# Patient Record
Sex: Female | Born: 1965 | Race: White | Hispanic: No | Marital: Married | Smoking: Never smoker
Health system: Southern US, Community
[De-identification: ages and names within clinical notes are randomized; demographics above are authoritative.]

## PROBLEM LIST (undated history)

## (undated) DIAGNOSIS — Z86010 Personal history of colonic polyps: Secondary | ICD-10-CM

## (undated) DIAGNOSIS — Z8632 Personal history of gestational diabetes: Secondary | ICD-10-CM

## (undated) DIAGNOSIS — G43009 Migraine without aura, not intractable, without status migrainosus: Secondary | ICD-10-CM

## (undated) HISTORY — PX: GYNECOLOGIC CRYOSURGERY: SHX857

## (undated) HISTORY — DX: Personal history of gestational diabetes: Z86.32

## (undated) HISTORY — DX: Personal history of colonic polyps: Z86.010

## (undated) HISTORY — PX: DILATION AND CURETTAGE OF UTERUS: SHX78

## (undated) HISTORY — DX: Migraine without aura, not intractable, without status migrainosus: G43.009

---

## 2002-10-26 ENCOUNTER — Other Ambulatory Visit: Admission: RE | Admit: 2002-10-26 | Discharge: 2002-10-26 | Payer: Self-pay | Admitting: Obstetrics and Gynecology

## 2006-09-10 ENCOUNTER — Other Ambulatory Visit: Admission: RE | Admit: 2006-09-10 | Discharge: 2006-09-10 | Payer: Self-pay | Admitting: Obstetrics and Gynecology

## 2008-10-05 ENCOUNTER — Other Ambulatory Visit: Admission: RE | Admit: 2008-10-05 | Discharge: 2008-10-05 | Payer: Self-pay | Admitting: Obstetrics and Gynecology

## 2008-10-18 ENCOUNTER — Ambulatory Visit (HOSPITAL_COMMUNITY): Admission: RE | Admit: 2008-10-18 | Discharge: 2008-10-18 | Payer: Self-pay | Admitting: Obstetrics and Gynecology

## 2008-10-25 ENCOUNTER — Encounter: Admission: RE | Admit: 2008-10-25 | Discharge: 2008-10-25 | Payer: Self-pay | Admitting: Obstetrics and Gynecology

## 2009-10-31 ENCOUNTER — Other Ambulatory Visit: Admission: RE | Admit: 2009-10-31 | Discharge: 2009-10-31 | Payer: Self-pay | Admitting: Obstetrics and Gynecology

## 2009-12-04 ENCOUNTER — Ambulatory Visit (HOSPITAL_COMMUNITY): Admission: RE | Admit: 2009-12-04 | Discharge: 2009-12-04 | Payer: Self-pay | Admitting: Obstetrics and Gynecology

## 2010-11-06 ENCOUNTER — Other Ambulatory Visit: Payer: Self-pay | Admitting: Obstetrics and Gynecology

## 2010-11-06 ENCOUNTER — Other Ambulatory Visit (HOSPITAL_COMMUNITY)
Admission: RE | Admit: 2010-11-06 | Discharge: 2010-11-06 | Disposition: A | Discharge status: Home / Self Care | Payer: BC Managed Care – PPO | Source: Ambulatory Visit | Attending: Obstetrics and Gynecology | Admitting: Obstetrics and Gynecology

## 2010-11-06 DIAGNOSIS — Z01419 Encounter for gynecological examination (general) (routine) without abnormal findings: Secondary | ICD-10-CM | POA: Insufficient documentation

## 2010-11-20 ENCOUNTER — Other Ambulatory Visit (HOSPITAL_COMMUNITY): Payer: Self-pay | Admitting: Obstetrics and Gynecology

## 2010-11-20 DIAGNOSIS — Z1231 Encounter for screening mammogram for malignant neoplasm of breast: Secondary | ICD-10-CM

## 2010-12-06 ENCOUNTER — Ambulatory Visit (HOSPITAL_COMMUNITY): Payer: BC Managed Care – PPO | Attending: Obstetrics and Gynecology

## 2011-02-12 ENCOUNTER — Ambulatory Visit (HOSPITAL_COMMUNITY): Payer: BC Managed Care – PPO

## 2011-03-06 ENCOUNTER — Ambulatory Visit (HOSPITAL_COMMUNITY)
Admission: RE | Admit: 2011-03-06 | Discharge: 2011-03-06 | Disposition: A | Discharge status: Home / Self Care | Payer: BC Managed Care – PPO | Source: Ambulatory Visit | Attending: Obstetrics and Gynecology | Admitting: Obstetrics and Gynecology

## 2011-03-06 DIAGNOSIS — Z1231 Encounter for screening mammogram for malignant neoplasm of breast: Secondary | ICD-10-CM | POA: Insufficient documentation

## 2012-08-12 ENCOUNTER — Other Ambulatory Visit (HOSPITAL_COMMUNITY): Payer: Self-pay | Admitting: Obstetrics and Gynecology

## 2012-08-12 DIAGNOSIS — Z1231 Encounter for screening mammogram for malignant neoplasm of breast: Secondary | ICD-10-CM

## 2012-08-14 ENCOUNTER — Ambulatory Visit (HOSPITAL_COMMUNITY)
Admission: RE | Admit: 2012-08-14 | Discharge: 2012-08-14 | Disposition: A | Discharge status: Home / Self Care | Payer: BC Managed Care – PPO | Source: Ambulatory Visit | Attending: Obstetrics and Gynecology | Admitting: Obstetrics and Gynecology

## 2012-08-14 DIAGNOSIS — Z1231 Encounter for screening mammogram for malignant neoplasm of breast: Secondary | ICD-10-CM

## 2013-06-22 ENCOUNTER — Other Ambulatory Visit (HOSPITAL_COMMUNITY): Payer: Self-pay | Admitting: Obstetrics and Gynecology

## 2013-06-22 DIAGNOSIS — Z1231 Encounter for screening mammogram for malignant neoplasm of breast: Secondary | ICD-10-CM

## 2013-08-16 ENCOUNTER — Ambulatory Visit (HOSPITAL_COMMUNITY)
Admission: RE | Admit: 2013-08-16 | Discharge: 2013-08-16 | Disposition: A | Discharge status: Home / Self Care | Payer: BC Managed Care – PPO | Source: Ambulatory Visit | Attending: Obstetrics and Gynecology | Admitting: Obstetrics and Gynecology

## 2013-08-16 DIAGNOSIS — Z1231 Encounter for screening mammogram for malignant neoplasm of breast: Secondary | ICD-10-CM | POA: Insufficient documentation

## 2014-08-23 ENCOUNTER — Ambulatory Visit (INDEPENDENT_AMBULATORY_CARE_PROVIDER_SITE_OTHER): Payer: BC Managed Care – PPO | Admitting: Internal Medicine

## 2014-08-23 ENCOUNTER — Ambulatory Visit (HOSPITAL_COMMUNITY)
Admission: RE | Admit: 2014-08-23 | Discharge: 2014-08-23 | Disposition: A | Discharge status: Home / Self Care | Payer: BLUE CROSS/BLUE SHIELD | Source: Ambulatory Visit | Attending: Internal Medicine | Admitting: Internal Medicine

## 2014-08-23 ENCOUNTER — Encounter: Payer: Self-pay | Admitting: Internal Medicine

## 2014-08-23 ENCOUNTER — Encounter: Payer: Self-pay | Admitting: *Deleted

## 2014-08-23 ENCOUNTER — Other Ambulatory Visit: Payer: Self-pay | Admitting: Internal Medicine

## 2014-08-23 VITALS — BP 119/65 | HR 78 | Resp 16 | Ht 63.75 in | Wt 161.0 lb

## 2014-08-23 DIAGNOSIS — R635 Abnormal weight gain: Secondary | ICD-10-CM | POA: Diagnosis not present

## 2014-08-23 DIAGNOSIS — Z1231 Encounter for screening mammogram for malignant neoplasm of breast: Secondary | ICD-10-CM | POA: Diagnosis not present

## 2014-08-23 DIAGNOSIS — G43809 Other migraine, not intractable, without status migrainosus: Secondary | ICD-10-CM

## 2014-08-23 DIAGNOSIS — N951 Menopausal and female climacteric states: Secondary | ICD-10-CM | POA: Diagnosis not present

## 2014-08-23 DIAGNOSIS — E559 Vitamin D deficiency, unspecified: Secondary | ICD-10-CM

## 2014-08-23 DIAGNOSIS — G43909 Migraine, unspecified, not intractable, without status migrainosus: Secondary | ICD-10-CM | POA: Insufficient documentation

## 2014-08-23 LAB — CBC WITH DIFFERENTIAL/PLATELET
Basophils Absolute: 0 10*3/uL (ref 0.0–0.1)
Basophils Relative: 0 % (ref 0–1)
Eosinophils Absolute: 0 10*3/uL (ref 0.0–0.7)
Eosinophils Relative: 1 % (ref 0–5)
HCT: 39.3 % (ref 36.0–46.0)
Hemoglobin: 13.6 g/dL (ref 12.0–15.0)
Lymphocytes Relative: 26 % (ref 12–46)
Lymphs Abs: 1.2 10*3/uL (ref 0.7–4.0)
MCH: 31.7 pg (ref 26.0–34.0)
MCHC: 34.6 g/dL (ref 30.0–36.0)
MCV: 91.6 fL (ref 78.0–100.0)
MPV: 11.5 fL (ref 8.6–12.4)
Monocytes Absolute: 0.4 10*3/uL (ref 0.1–1.0)
Monocytes Relative: 8 % (ref 3–12)
Neutro Abs: 3.1 10*3/uL (ref 1.7–7.7)
Neutrophils Relative %: 65 % (ref 43–77)
Platelets: 177 10*3/uL (ref 150–400)
RBC: 4.29 MIL/uL (ref 3.87–5.11)
RDW: 12.5 % (ref 11.5–15.5)
WBC: 4.8 10*3/uL (ref 4.0–10.5)

## 2014-08-23 LAB — LIPID PANEL
Cholesterol: 185 mg/dL (ref 0–200)
HDL: 64 mg/dL (ref >39)
LDL Cholesterol: 95 mg/dL (ref 0–99)
Total CHOL/HDL Ratio: 2.9 Ratio
Triglycerides: 128 mg/dL (ref <150)
VLDL: 26 mg/dL (ref 0–40)

## 2014-08-23 LAB — COMPREHENSIVE METABOLIC PANEL
ALT: 12 U/L (ref 0–35)
AST: 19 U/L (ref 0–37)
Albumin: 4.3 g/dL (ref 3.5–5.2)
Alkaline Phosphatase: 56 U/L (ref 39–117)
BUN: 11 mg/dL (ref 6–23)
CO2: 25 mEq/L (ref 19–32)
Calcium: 9 mg/dL (ref 8.4–10.5)
Chloride: 102 mEq/L (ref 96–112)
Creat: 0.57 mg/dL (ref 0.50–1.10)
Glucose, Bld: 80 mg/dL (ref 70–99)
Potassium: 3.9 mEq/L (ref 3.5–5.3)
Sodium: 137 mEq/L (ref 135–145)
Total Bilirubin: 0.5 mg/dL (ref 0.2–1.2)
Total Protein: 7.3 g/dL (ref 6.0–8.3)

## 2014-08-23 LAB — T3, FREE: T3, Free: 3 pg/mL (ref 2.3–4.2)

## 2014-08-23 LAB — T4, FREE: Free T4: 1 ng/dL (ref 0.80–1.80)

## 2014-08-23 LAB — TSH: TSH: 1.235 u[IU]/mL (ref 0.350–4.500)

## 2014-08-23 NOTE — Patient Instructions (Signed)
See me as needed 

## 2014-08-23 NOTE — Progress Notes (Signed)
   Subjective:    Patient ID: Tammy Terrell, female    DOB: 1966/08/01, 49 y.o.   MRN: [id number]  HPI New pt here for first visit   PMH migraine headache  ( diagnosed in childhood),  Vitamin D deficiency  She is concerned about weight gain.  Has tried [employer] in past and was on an herbal/supplement diet that made her nauseated   LMP December,  Has started to skip menses.  Only one hot flush a day.  No night time flushing   No Known Allergies History reviewed. No pertinent past medical history. Past Surgical History  Procedure Laterality Date  . Dilation and curettage of uterus  1997/1998   History   Social History  . Marital Status: Married    Spouse Name: N/A    Number of Children: N/A  . Years of Education: N/A   Occupational History  . Not on file.   Social History Main Topics  . Smoking status: Never Smoker   . Smokeless tobacco: Never Used  . Alcohol Use: 1.2 oz/week    2 Not specified per week  . Drug Use: No  . Sexual Activity:    Partners: Male   Other Topics Concern  . Not on file   Social History Narrative  . No narrative on file   Family History  Problem Relation Age of Onset  . Diabetes Mother   . Melanoma Mother   . Heart disease Father   . Colon cancer Paternal Aunt    There are no active problems to display for this patient.  No current outpatient prescriptions on file prior to visit.   No current facility-administered medications on file prior to visit.       Review of Systems See HPI    Objective:   Physical Exam Physical Exam  Nursing note and vitals reviewed.  Constitutional: She is oriented to person, place, and time. She appears well-developed and well-nourished.  HENT:  Head: Normocephalic and atraumatic.  Cardiovascular: Normal rate and regular rhythm. Exam reveals no gallop and no friction rub.  No murmur heard.  Pulmonary/Chest: Breath sounds normal. She has no wheezes. She has no rales.  Neurological: She is alert and  oriented to person, place, and time.  Skin: Skin is warm and dry.  Psychiatric: She has a normal mood and affect. Her behavior is normal.              Assessment & Plan:  Weight gain  Will get TSH and all labs  Irregular menses  No heavy bleeding  Will get FSH,  Thyroid   Migraine  Continue OTC of choice  Vitamin D deficiency will check today  Schedule CPE

## 2014-08-24 LAB — VITAMIN D 25 HYDROXY (VIT D DEFICIENCY, FRACTURES): Vit D, 25-Hydroxy: 35 ng/mL (ref 30–100)

## 2014-08-28 ENCOUNTER — Encounter: Payer: Self-pay | Admitting: Internal Medicine

## 2015-05-31 ENCOUNTER — Encounter: Payer: BC Managed Care – PPO | Admitting: Internal Medicine

## 2015-08-18 ENCOUNTER — Other Ambulatory Visit: Payer: Self-pay

## 2015-08-18 DIAGNOSIS — Z1231 Encounter for screening mammogram for malignant neoplasm of breast: Secondary | ICD-10-CM

## 2015-09-06 ENCOUNTER — Ambulatory Visit: Payer: BC Managed Care – PPO

## 2015-12-29 ENCOUNTER — Ambulatory Visit
Admission: RE | Admit: 2015-12-29 | Discharge: 2015-12-29 | Disposition: A | Discharge status: Home / Self Care | Payer: Self-pay | Source: Ambulatory Visit

## 2015-12-29 DIAGNOSIS — Z1231 Encounter for screening mammogram for malignant neoplasm of breast: Secondary | ICD-10-CM

## 2017-03-26 ENCOUNTER — Other Ambulatory Visit: Payer: Self-pay | Admitting: Obstetrics and Gynecology

## 2017-03-26 DIAGNOSIS — Z1231 Encounter for screening mammogram for malignant neoplasm of breast: Secondary | ICD-10-CM

## 2017-03-27 ENCOUNTER — Ambulatory Visit
Admission: RE | Admit: 2017-03-27 | Discharge: 2017-03-27 | Disposition: A | Discharge status: Home / Self Care | Payer: Managed Care, Other (non HMO) | Source: Ambulatory Visit | Attending: Obstetrics and Gynecology | Admitting: Obstetrics and Gynecology

## 2017-03-27 DIAGNOSIS — Z1231 Encounter for screening mammogram for malignant neoplasm of breast: Secondary | ICD-10-CM

## 2017-05-01 ENCOUNTER — Encounter: Payer: Self-pay | Admitting: Obstetrics and Gynecology

## 2017-05-01 ENCOUNTER — Ambulatory Visit (INDEPENDENT_AMBULATORY_CARE_PROVIDER_SITE_OTHER): Payer: Managed Care, Other (non HMO) | Admitting: Obstetrics and Gynecology

## 2017-05-01 ENCOUNTER — Other Ambulatory Visit (HOSPITAL_COMMUNITY)
Admission: RE | Admit: 2017-05-01 | Discharge: 2017-05-01 | Disposition: A | Discharge status: Home / Self Care | Payer: Managed Care, Other (non HMO) | Source: Ambulatory Visit | Attending: Obstetrics and Gynecology | Admitting: Obstetrics and Gynecology

## 2017-05-01 VITALS — BP 112/62 | HR 76 | Resp 16 | Ht 64.77 in | Wt 165.8 lb

## 2017-05-01 DIAGNOSIS — Z01419 Encounter for gynecological examination (general) (routine) without abnormal findings: Secondary | ICD-10-CM | POA: Diagnosis not present

## 2017-05-01 DIAGNOSIS — Z124 Encounter for screening for malignant neoplasm of cervix: Secondary | ICD-10-CM | POA: Diagnosis present

## 2017-05-01 DIAGNOSIS — Z8632 Personal history of gestational diabetes: Secondary | ICD-10-CM | POA: Diagnosis not present

## 2017-05-01 DIAGNOSIS — R635 Abnormal weight gain: Secondary | ICD-10-CM

## 2017-05-01 DIAGNOSIS — N951 Menopausal and female climacteric states: Secondary | ICD-10-CM | POA: Diagnosis not present

## 2017-05-01 DIAGNOSIS — Z0000 Encounter for general adult medical examination without abnormal findings: Secondary | ICD-10-CM

## 2017-05-01 NOTE — Progress Notes (Signed)
51 y.o. [id number] MarriedCaucasianF here as a new patient for annual exam.  Patient states that her last period was in November 2017 and before that around March 2017. No spotting since 11/17.  She is having hot flashes, night sweats in the last several months (have come and gone over the last few years). Not currently tolerable. She has put on weight, hoping it will improve if she looses some weight.  She is up 2-3 x a night. Not sweating as badly as she did in the past.  Sexually active, no pain    No LMP recorded. Patient is not currently having periods (Reason: Irregular Periods).          Sexually active: Yes.    The current method of family planning is vasectomy.    Exercising: Yes.    Elliptical and bike Smoker:  no  Health Maintenance: Pap:  2014 with Dr. Teryl Lucy - normal per patient History of abnormal Pap:  Yes, years ago -- patient had Cryosurgery MMG:  03/27/17 BIRADS 1 negative/density b Colonoscopy:  n/a BMD:   Done with Dr. Clayborne Dana at [facility] -- normal per patient TDaP:  Unsure if UTD Gardasil: n/a   reports that she has never smoked. She has never used smokeless tobacco. She reports that she drinks about 1.2 oz of alcohol per week . She reports that she does not use drugs. Homemaker. Kids are 23, 55, 48. 16 year old daughter is a Equities trader in [employer]. 49 year old in school. Oldest son is applying to med school.   Past Medical History:  Diagnosis Date  . Migraine without aura     Past Surgical History:  Procedure Laterality Date  . [place] OF UTERUS  1997/1998  . GYNECOLOGIC CRYOSURGERY    One D&C was for retained placenta.  2 D&C's for SAB  Current Outpatient Prescriptions  Medication Sig Dispense Refill  . Multiple Vitamin (MULTIVITAMIN) tablet Take 1 tablet by mouth daily.     No current facility-administered medications for this visit.     Family History  Problem Relation Age of Onset  . Diabetes Mother   . Melanoma Mother    . Heart disease Father   . Seizures Father   . Colon cancer Paternal Aunt   . Heart disease Maternal Grandmother   . Diabetes Maternal Grandfather   . Cancer Paternal Grandfather   PGF not sure what type of cancer PAunt was close to 12 with her colon cancer.   Review of Systems  Constitutional: Negative.   HENT: Negative.   Eyes: Negative.   Respiratory: Negative.   Cardiovascular: Negative.   Gastrointestinal: Negative.   Endocrine: Negative.   Genitourinary: Negative.   Musculoskeletal: Negative.   Skin: Negative.   Allergic/Immunologic: Negative.   Neurological: Negative.   Hematological: Negative.   Psychiatric/Behavioral: Negative.     Exam:   BP 112/62 (BP Location: Right Arm, Patient Position: Sitting, Cuff Size: Normal)   Pulse 76   Resp 16   Ht 5' 4.77" (1.645 m)   Wt 165 lb 12.8 oz (75.2 kg)   BMI 27.79 kg/m   Weight change: @WEIGHTCHANGE @ Height:   Height: 5' 4.77" (164.5 cm)  Ht Readings from Last 3 Encounters:  05/01/17 5' 4.77" (1.645 m)  08/23/14 5' 3.75" (1.619 m)    General appearance: alert, cooperative and appears stated age Head: Normocephalic, without obvious abnormality, atraumatic Neck: no adenopathy, supple, symmetrical, trachea midline and thyroid normal to inspection and palpation Lungs:  clear to auscultation bilaterally Cardiovascular: regular rate and rhythm Breasts: normal appearance, no masses or tenderness Abdomen: soft, non-tender; non distended,  no masses,  no organomegaly Extremities: extremities normal, atraumatic, no cyanosis or edema Skin: Skin color, texture, turgor normal. No rashes or lesions Lymph nodes: Cervical, supraclavicular, and axillary nodes normal. No abnormal inguinal nodes palpated Neurologic: Grossly normal   Pelvic: External genitalia:  no lesions              Urethra:  normal appearing urethra with no masses, tenderness or lesions              Bartholins and Skenes: normal                 Vagina: normal  appearing vagina with normal color and discharge, no lesions              Cervix: no lesions               Bimanual Exam:  Uterus:  normal size, contour, position, consistency, mobility, non-tender              Adnexa: no mass, fullness, tenderness               Rectovaginal: Confirms               Anus:  normal sphincter tone, no lesions  Chaperone was present for exam.  A:  Well Woman with normal exam  Vasomotor symptoms  Weight gain  Gestational diabetes  P:   She will try OTC agents for menopausal symptoms, given information on HRT  Call with any bleeding  Pap with hpv  Discussed breast self exam  Discussed calcium and vit D intake  Mammogram UTD  Discussed colonoscopy now or waiting until 52, she wants to wait  Screening labs with tsh   HgbA1C

## 2017-05-01 NOTE — Patient Instructions (Addendum)
Try over the counter estroven and estroven pm  EXERCISE AND DIET:  We recommended that you start or continue a regular exercise program for good health. Regular exercise means any activity that makes your heart beat faster and makes you sweat.  We recommend exercising at least 30 minutes per day at least 3 days a week, preferably 4 or 5.  We also recommend a diet low in fat and sugar.  Inactivity, poor dietary choices and obesity can cause diabetes, heart attack, stroke, and kidney damage, among others.    ALCOHOL AND SMOKING:  Women should limit their alcohol intake to no more than 7 drinks/beers/glasses of wine (combined, not each!) per week. Moderation of alcohol intake to this level decreases your risk of breast cancer and liver damage. And of course, no recreational drugs are part of a healthy lifestyle.  And absolutely no smoking or even second hand smoke. Most people know smoking can cause heart and lung diseases, but did you know it also contributes to weakening of your bones? Aging of your skin?  Yellowing of your teeth and nails?  CALCIUM AND VITAMIN D:  Adequate intake of calcium and Vitamin D are recommended.  The recommendations for exact amounts of these supplements seem to change often, but generally speaking 600 mg of calcium (either carbonate or citrate) and 800 units of Vitamin D per day seems prudent. Certain women may benefit from higher intake of Vitamin D.  If you are among these women, your doctor will have told you during your visit.    PAP SMEARS:  Pap smears, to check for cervical cancer or precancers,  have traditionally been done yearly, although recent scientific advances have shown that most women can have pap smears less often.  However, every woman still should have a physical exam from her gynecologist every year. It will include a breast check, inspection of the vulva and vagina to check for abnormal growths or skin changes, a visual exam of the cervix, and then an exam to  evaluate the size and shape of the uterus and ovaries.  And after 49 years of age, a rectal exam is indicated to check for rectal cancers. We will also provide age appropriate advice regarding health maintenance, like when you should have certain vaccines, screening for sexually transmitted diseases, bone density testing, colonoscopy, mammograms, etc.   MAMMOGRAMS:  All women over 43 years old should have a yearly mammogram. Many facilities now offer a "3D" mammogram, which may cost around $50 extra out of pocket. If possible,  we recommend you accept the option to have the 3D mammogram performed.  It both reduces the number of women who will be called back for extra views which then turn out to be normal, and it is better than the routine mammogram at detecting truly abnormal areas.    COLONOSCOPY:  Colonoscopy to screen for colon cancer is recommended for all women at age 87.  We know, you hate the idea of the prep.  We agree, BUT, having colon cancer and not knowing it is worse!!  Colon cancer so often starts as a polyp that can be seen and removed at colonscopy, which can quite literally save your life!  And if your first colonoscopy is normal and you have no family history of colon cancer, most women don't have to have it again for 10 years.  Once every ten years, you can do something that may end up saving your life, right?  We will be happy to  help you get it scheduled when you are ready.  Be sure to check your insurance coverage so you understand how much it will cost.  It may be covered as a preventative service at no cost, but you should check your particular policy.      Menopause and Hormone Replacement Therapy What is hormone replacement therapy? Hormone replacement therapy (HRT) is the use of artificial (synthetic) hormones to replace hormones that your body stops producing during menopause. Menopause is the normal time of life when menstrual periods stop completely and the ovaries stop  producing the female hormones estrogen and progesterone. This lack of hormones can affect your health and cause undesirable symptoms. HRT can relieve some of those symptoms. What are my options for HRT? HRT may consist of the synthetic hormones estrogen and progestin, or it may consist of only estrogen (estrogen-only therapy). You and your health care provider will decide which form of HRT is best for you. If you choose to be on HRT and you have a uterus, estrogen and progestin are usually prescribed. Estrogen-only therapy is used for women who do not have a uterus. Possible options for taking HRT include:  Pills.  Patches.  Gels.  Sprays.  Vaginal cream.  Vaginal rings.  Vaginal inserts.  The amount of hormone(s) that you take and how long you take the hormone(s) varies depending on your individual health. It is important to:  Begin HRT with the lowest possible dosage.  Stop HRT as soon as your health care provider tells you to stop.  Work with your health care provider so that you feel informed and comfortable with your decisions.  What are the benefits of HRT? HRT can reduce the frequency and severity of menopausal symptoms. Benefits of HRT vary depending on the menopausal symptoms that you have, the severity of your symptoms, and your overall health. HRT may help to improve the following menopausal symptoms:  Hot flashes and night sweats. These are sudden feelings of heat that spread over the face and body. The skin may turn red, like a blush. Night sweats are hot flashes that happen while you are sleeping or trying to sleep.  Bone loss (osteoporosis). The body loses calcium more quickly after menopause, causing the bones to become weaker. This can increase the risk for bone breaks (fractures).  Vaginal dryness. The lining of the vagina can become thin and dry, which can cause pain during sexual intercourse or cause infection, burning, or itching.  Urinary tract  infections.  Urinary incontinence. This is a decreased ability to control when you urinate.  Irritability.  Short-term memory problems.  What are the risks of HRT? Risks of HRT vary depending on your individual health and medical history. Risks of HRT also depend on whether you receive both estrogen and progestin or you receive estrogen only.HRT may increase the risk of:  Spotting. This is when a small amount of bloodleaks from the vagina unexpectedly.  Endometrial cancer. This cancer is in the lining of the uterus (endometrium).  Breast cancer.  Increased density of breast tissue. This can make it harder to find breast cancer on a breast X-ray (mammogram).  Stroke.  Heart attack.  Blood clots.  Gallbladder disease.  Risks of HRT can increase if you have any of the following conditions:  Endometrial cancer.  Liver disease.  Heart disease.  Breast cancer.  History of blood clots.  History of stroke.  How should I care for myself while I am on HRT?  Take over-the-counter  and prescription medicines only as told by your health care provider.  Get mammograms, pelvic exams, and medical checkups as often as told by your health care provider.  Have Pap tests done as often as told by your health care provider. A Pap test is sometimes called a Pap smear. It is a screening test that is used to check for signs of cancer of the cervix and vagina. A Pap test can also identify the presence of infection or precancerous changes. Pap tests may be done: ? Every 3 years, starting at age 49. ? Every 5 years, starting after age 63, in combination with testing for human papillomavirus (HPV). ? More often or less often depending on other medical conditions you have, your age, and other risk factors.  It is your responsibility to get your Pap test results. Ask your health care provider or the department performing the test when your results will be ready.  Keep all follow-up visits as  told by your health care provider. This is important. When should I seek medical care? Talk with your health care provider if:  You have any of these: ? Pain or swelling in your legs. ? Shortness of breath. ? Chest pain. ? Lumps or changes in your breasts or armpits. ? Slurred speech. ? Pain, burning, or bleeding when you urine.  You develop any of these: ? Unusual vaginal bleeding. ? Dizziness or headaches. ? Weakness or numbness in any part of your arms or legs. ? Pain in your abdomen.  This information is not intended to replace advice given to you by your health care provider. Make sure you discuss any questions you have with your health care provider. Document Released: 04/20/2003 Document Revised: 06/18/2016 Document Reviewed: 01/23/2015 Elsevier Interactive Patient Education  2017 [employer].

## 2017-05-02 LAB — COMPREHENSIVE METABOLIC PANEL
ALT: 7 IU/L (ref 0–32)
AST: 15 IU/L (ref 0–40)
Albumin/Globulin Ratio: 1.9 (ref 1.2–2.2)
Albumin: 4.9 g/dL (ref 3.5–5.5)
Alkaline Phosphatase: 72 IU/L (ref 39–117)
BUN/Creatinine Ratio: 15 (ref 9–23)
BUN: 10 mg/dL (ref 6–24)
Bilirubin Total: 0.6 mg/dL (ref 0.0–1.2)
CO2: 24 mmol/L (ref 20–29)
Calcium: 9.6 mg/dL (ref 8.7–10.2)
Chloride: 101 mmol/L (ref 96–106)
Creatinine, Ser: 0.67 mg/dL (ref 0.57–1.00)
GFR calc Af Amer: 120 mL/min/{1.73_m2} (ref >59)
GFR calc non Af Amer: 104 mL/min/{1.73_m2} (ref >59)
Globulin, Total: 2.6 g/dL (ref 1.5–4.5)
Glucose: 92 mg/dL (ref 65–99)
Potassium: 4.3 mmol/L (ref 3.5–5.2)
Sodium: 140 mmol/L (ref 134–144)
Total Protein: 7.5 g/dL (ref 6.0–8.5)

## 2017-05-02 LAB — CBC
Hematocrit: 40.8 % (ref 34.0–46.6)
Hemoglobin: 14.3 g/dL (ref 11.1–15.9)
MCH: 31.6 pg (ref 26.6–33.0)
MCHC: 35 g/dL (ref 31.5–35.7)
MCV: 90 fL (ref 79–97)
Platelets: 205 10*3/uL (ref 150–379)
RBC: 4.52 x10E6/uL (ref 3.77–5.28)
RDW: 12.6 % (ref 12.3–15.4)
WBC: 4.5 10*3/uL (ref 3.4–10.8)

## 2017-05-02 LAB — CYTOLOGY - PAP
Adequacy: ABSENT
Diagnosis: NEGATIVE
HPV: NOT DETECTED

## 2017-05-02 LAB — TSH: TSH: 1.25 u[IU]/mL (ref 0.450–4.500)

## 2017-05-02 LAB — LIPID PANEL
Chol/HDL Ratio: 3.1 ratio (ref 0.0–4.4)
Cholesterol, Total: 187 mg/dL (ref 100–199)
HDL: 60 mg/dL (ref >39)
LDL Calculated: 98 mg/dL (ref 0–99)
Triglycerides: 143 mg/dL (ref 0–149)
VLDL Cholesterol Cal: 29 mg/dL (ref 5–40)

## 2017-05-02 LAB — HEMOGLOBIN A1C
Est. average glucose Bld gHb Est-mCnc: 100 mg/dL
Hgb A1c MFr Bld: 5.1 % (ref 4.8–5.6)

## 2018-05-04 NOTE — Progress Notes (Signed)
52 y.o. [id number] Married White or Caucasian Not Hispanic or Latino female here for annual exam.  She had some brown vaginal discharge begining of September. Prior to that she had spotting ~2/19, prior to that over a year without bleeding. Bad vasomotor symptoms. Not sleeping well. She is up 3-4 x a night either hot or sweating. She is having hot flashes 5-6 x a day. She hasn't tried the over the counter products. She c/o hair loss.  Husband had a CABG in 4/19, doing well. Kids are in school/working. In the process of downsizing, in a rental property currently, building.     No LMP recorded. (Menstrual status: Irregular Periods).          Sexually active: Yes.    The current method of family planning is vasectomy.    Exercising: No.  The patient does not participate in regular exercise at present. Smoker:  no  Health Maintenance: Pap:  05/01/2017 normal with negative HPV, 2014 normal per patient History of abnormal Pap:  Yes, years ago -- patient had Cryosurgery MMG:  03/27/2017 Birads 1 negative Colonoscopy: never BMD: Done with Dr. Clayborne Dana at [facility], normal per patient  TDaP: 02/2018  Gardasil: N/A   reports that she has never smoked. She has never used smokeless tobacco. She reports that she drinks about 2.0 standard drinks of alcohol per week. She reports that she does not use drugs. Homemaker, kids are 19,20 and 24.   Past Medical History:  Diagnosis Date  . History of gestational diabetes   . Migraine without aura     Past Surgical History:  Procedure Laterality Date  . [place] OF UTERUS  1997/1998  . GYNECOLOGIC CRYOSURGERY      Current Outpatient Medications  Medication Sig Dispense Refill  . Multiple Vitamin (MULTIVITAMIN) tablet Take 1 tablet by mouth daily.     No current facility-administered medications for this visit.     Family History  Problem Relation Age of Onset  . Diabetes Mother   . Melanoma Mother   . Heart disease  Father   . Seizures Father   . Colon cancer Paternal Aunt   . Heart disease Maternal Grandmother   . Diabetes Maternal Grandfather   . Cancer Paternal Grandfather     Review of Systems  Constitutional: Positive for unexpected weight change.       Weight gain    HENT: Positive for hearing loss.        Sinusitis    Eyes: Negative.   Respiratory: Negative.   Cardiovascular: Negative.   Gastrointestinal: Negative.   Endocrine:       Craving sweets   Genitourinary: Positive for frequency, menstrual problem and vaginal discharge.       Loss of urine with cough or sneeze Loss of sexual interest  Musculoskeletal: Negative.   Skin:       New or changed mole/lump Hair loss  Allergic/Immunologic: Negative.   Neurological: Positive for headaches.  Hematological: Negative.   Psychiatric/Behavioral: Negative.   GSI with URI  Exam:   BP 112/68 (BP Location: Right Arm, Patient Position: Sitting, Cuff Size: Normal)   Pulse 78   Resp 14   Ht 5\' 5"  (1.651 m)   Wt 161 lb 6.4 oz (73.2 kg)   BMI 26.86 kg/m   Weight change: @WEIGHTCHANGE @ Height:   Height: 5\' 5"  (165.1 cm)  Ht Readings from Last 3 Encounters:  05/07/18 5\' 5"  (1.651 m)  05/01/17 5' 4.77" (1.645 m)  08/23/14 5' 3.75" (1.619 m)    General appearance: alert, cooperative and appears stated age Head: Normocephalic, without obvious abnormality, atraumatic Neck: no adenopathy, supple, symmetrical, trachea midline and thyroid normal to inspection and palpation Lungs: clear to auscultation bilaterally Cardiovascular: regular rate and rhythm Breasts: normal appearance, no masses or tenderness Abdomen: soft, non-tender; non distended,  no masses,  no organomegaly Extremities: extremities normal, atraumatic, no cyanosis or edema Skin: Skin color, texture, turgor normal. No rashes or lesions Lymph nodes: Cervical, supraclavicular, and axillary nodes normal. No abnormal inguinal nodes palpated Neurologic: Grossly  normal   Pelvic: External genitalia:  no lesions              Urethra:  normal appearing urethra with no masses, tenderness or lesions              Bartholins and Skenes: normal                 Vagina: normal appearing vagina with normal color and discharge, no lesions              Cervix: no lesions and stenotic               Bimanual Exam:  Uterus:  normal size, contour, position, consistency, mobility, non-tender and anteverted              Adnexa: no mass, fullness, tenderness               Rectovaginal: Confirms               Anus:  normal sphincter tone, no lesions  Chaperone was present for exam.  A:  Well Woman with normal exam  Postmenopausal bleeding  Stenotic cervix  Hair loss  Vasomotor symptoms  H/O gestational diabetes  H/O vit d def  P:   No pap this year  Mammogram due  Colonoscopy after 52  Screening labs, TSH, FSH, Vit D, HgbA1C  Discussed breast self exam  Discussed calcium and vit D intake  Return for ultrasound, possible sonohysterogram, possible endometrial biopsy. Will pre-treat with cytotec

## 2018-05-07 ENCOUNTER — Encounter: Payer: Self-pay | Admitting: Obstetrics and Gynecology

## 2018-05-07 ENCOUNTER — Ambulatory Visit: Payer: Managed Care, Other (non HMO) | Admitting: Obstetrics and Gynecology

## 2018-05-07 ENCOUNTER — Other Ambulatory Visit: Payer: Self-pay

## 2018-05-07 ENCOUNTER — Encounter: Payer: Self-pay | Admitting: Internal Medicine

## 2018-05-07 VITALS — BP 112/68 | HR 78 | Resp 14 | Ht 65.0 in | Wt 161.4 lb

## 2018-05-07 DIAGNOSIS — L659 Nonscarring hair loss, unspecified: Secondary | ICD-10-CM | POA: Diagnosis not present

## 2018-05-07 DIAGNOSIS — N950 Postmenopausal bleeding: Secondary | ICD-10-CM | POA: Diagnosis not present

## 2018-05-07 DIAGNOSIS — Z01419 Encounter for gynecological examination (general) (routine) without abnormal findings: Secondary | ICD-10-CM

## 2018-05-07 DIAGNOSIS — Z8632 Personal history of gestational diabetes: Secondary | ICD-10-CM

## 2018-05-07 DIAGNOSIS — Z0000 Encounter for general adult medical examination without abnormal findings: Secondary | ICD-10-CM

## 2018-05-07 DIAGNOSIS — E559 Vitamin D deficiency, unspecified: Secondary | ICD-10-CM

## 2018-05-07 DIAGNOSIS — N951 Menopausal and female climacteric states: Secondary | ICD-10-CM | POA: Diagnosis not present

## 2018-05-07 DIAGNOSIS — Z1211 Encounter for screening for malignant neoplasm of colon: Secondary | ICD-10-CM

## 2018-05-07 MED ORDER — MISOPROSTOL 200 MCG PO TABS: ORAL_TABLET | ORAL | 0 refills | Status: DC

## 2018-05-07 NOTE — Patient Instructions (Addendum)
Tammy Terrell Register for day time hot flashes and [place] pm for night sweats  EXERCISE AND DIET:  We recommended that you start or continue a regular exercise program for good health. Regular exercise means any activity that makes your heart beat faster and makes you sweat.  We recommend exercising at least 30 minutes per day at least 3 days a week, preferably 4 or 5.  We also recommend a diet low in fat and sugar.  Inactivity, poor dietary choices and obesity can cause diabetes, heart attack, stroke, and kidney damage, among others.    ALCOHOL AND SMOKING:  Women should limit their alcohol intake to no more than 7 drinks/beers/glasses of wine (combined, not each!) per week. Moderation of alcohol intake to this level decreases your risk of breast cancer and liver damage. And of course, no recreational drugs are part of a healthy lifestyle.  And absolutely no smoking or even second hand smoke. Most people know smoking can cause heart and lung diseases, but did you know it also contributes to weakening of your bones? Aging of your skin?  Yellowing of your teeth and nails?  CALCIUM AND VITAMIN D:  Adequate intake of calcium and Vitamin D are recommended.  The recommendations for exact amounts of these supplements seem to change often, but generally speaking 600 mg of calcium (either carbonate or citrate) and 800 units of Vitamin D per day seems prudent. Certain women may benefit from higher intake of Vitamin D.  If you are among these women, your doctor will have told you during your visit.    PAP SMEARS:  Pap smears, to check for cervical cancer or precancers,  have traditionally been done yearly, although recent scientific advances have shown that most women can have pap smears less often.  However, every woman still should have a physical exam from her gynecologist every year. It will include a breast check, inspection of the vulva and vagina to check for abnormal growths or skin changes, a visual exam of the  cervix, and then an exam to evaluate the size and shape of the uterus and ovaries.  And after 50 years of age, a rectal exam is indicated to check for rectal cancers. We will also provide age appropriate advice regarding health maintenance, like when you should have certain vaccines, screening for sexually transmitted diseases, bone density testing, colonoscopy, mammograms, etc.   MAMMOGRAMS:  All women over 50 years old should have a yearly mammogram. Many facilities now offer a "3D" mammogram, which may cost around $50 extra out of pocket. If possible,  we recommend you accept the option to have the 3D mammogram performed.  It both reduces the number of women who will be called back for extra views which then turn out to be normal, and it is better than the routine mammogram at detecting truly abnormal areas.    COLONOSCOPY:  Colonoscopy to screen for colon cancer is recommended for all women at age 50.  We know, you hate the idea of the prep.  We agree, BUT, having colon cancer and not knowing it is worse!!  Colon cancer so often starts as a polyp that can be seen and removed at colonscopy, which can quite literally save your life!  And if your first colonoscopy is normal and you have no family history of colon cancer, most women don't have to have it again for 10 years.  Once every ten years, you can do something that may end up saving your life, right?  We will be happy to help you get it scheduled when you are ready.  Be sure to check your insurance coverage so you understand how much it will cost.  It may be covered as a preventative service at no cost, but you should check your particular policy.      Breast Self-Awareness Breast self-awareness means being familiar with how your breasts look and feel. It involves checking your breasts regularly and reporting any changes to your health care provider. Practicing breast self-awareness is important. A change in your breasts can be a sign of a serious  medical problem. Being familiar with how your breasts look and feel allows you to find any problems early, when treatment is more likely to be successful. All women should practice breast self-awareness, including women who have had breast implants. How to do a breast self-exam One way to learn what is normal for your breasts and whether your breasts are changing is to do a breast self-exam. To do a breast self-exam: Look for Changes  1. Remove all the clothing above your waist. 2. Stand in front of a mirror in a room with good lighting. 3. Put your hands on your hips. 4. Push your hands firmly downward. 5. Compare your breasts in the mirror. Look for differences between them (asymmetry), such as: ? Differences in shape. ? Differences in size. ? Puckers, dips, and bumps in one breast and not the other. 6. Look at each breast for changes in your skin, such as: ? Redness. ? Scaly areas. 7. Look for changes in your nipples, such as: ? Discharge. ? Bleeding. ? Dimpling. ? Redness. ? A change in position. Feel for Changes  Carefully feel your breasts for lumps and changes. It is best to do this while lying on your back on the floor and again while sitting or standing in the shower or tub with soapy water on your skin. Feel each breast in the following way:  Place the arm on the side of the breast you are examining above your head.  Feel your breast with the other hand.  Start in the nipple area and make  inch (2 cm) overlapping circles to feel your breast. Use the pads of your three middle fingers to do this. Apply light pressure, then medium pressure, then firm pressure. The light pressure will allow you to feel the tissue closest to the skin. The medium pressure will allow you to feel the tissue that is a little deeper. The firm pressure will allow you to feel the tissue close to the ribs.  Continue the overlapping circles, moving downward over the breast until you feel your ribs below  your breast.  Move one finger-width toward the center of the body. Continue to use the  inch (2 cm) overlapping circles to feel your breast as you move slowly up toward your collarbone.  Continue the up and down exam using all three pressures until you reach your armpit.  Write Down What You Find  Write down what is normal for each breast and any changes that you find. Keep a written record with breast changes or normal findings for each breast. By writing this information down, you do not need to depend only on memory for size, tenderness, or location. Write down where you are in your menstrual cycle, if you are still menstruating. If you are having trouble noticing differences in your breasts, do not get discouraged. With time you will become more familiar with the variations in your  breasts and more comfortable with the exam. How often should I examine my breasts? Examine your breasts every month. If you are breastfeeding, the best time to examine your breasts is after a feeding or after using a breast pump. If you menstruate, the best time to examine your breasts is 5-7 days after your period is over. During your period, your breasts are lumpier, and it may be more difficult to notice changes. When should I see my health care provider? See your health care provider if you notice:  A change in shape or size of your breasts or nipples.  A change in the skin of your breast or nipples, such as a reddened or scaly area.  Unusual discharge from your nipples.  A lump or thick area that was not there before.  Pain in your breasts.  Anything that concerns you.  This information is not intended to replace advice given to you by your health care provider. Make sure you discuss any questions you have with your health care provider. Document Released: 07/22/2005 Document Revised: 12/28/2015 Document Reviewed: 06/11/2015 Elsevier Interactive Patient Education  [employer].

## 2018-05-08 LAB — COMPREHENSIVE METABOLIC PANEL
ALT: 11 IU/L (ref 0–32)
AST: 13 IU/L (ref 0–40)
Albumin/Globulin Ratio: 1.7 (ref 1.2–2.2)
Albumin: 4.8 g/dL (ref 3.5–5.5)
Alkaline Phosphatase: 82 IU/L (ref 39–117)
BUN/Creatinine Ratio: 13 (ref 9–23)
BUN: 9 mg/dL (ref 6–24)
Bilirubin Total: 0.4 mg/dL (ref 0.0–1.2)
CO2: 25 mmol/L (ref 20–29)
Calcium: 10 mg/dL (ref 8.7–10.2)
Chloride: 101 mmol/L (ref 96–106)
Creatinine, Ser: 0.69 mg/dL (ref 0.57–1.00)
GFR calc Af Amer: 118 mL/min/{1.73_m2} (ref >59)
GFR calc non Af Amer: 103 mL/min/{1.73_m2} (ref >59)
Globulin, Total: 2.8 g/dL (ref 1.5–4.5)
Glucose: 79 mg/dL (ref 65–99)
Potassium: 4.4 mmol/L (ref 3.5–5.2)
Sodium: 142 mmol/L (ref 134–144)
Total Protein: 7.6 g/dL (ref 6.0–8.5)

## 2018-05-08 LAB — CBC
Hematocrit: 41.3 % (ref 34.0–46.6)
Hemoglobin: 13.8 g/dL (ref 11.1–15.9)
MCH: 31.2 pg (ref 26.6–33.0)
MCHC: 33.4 g/dL (ref 31.5–35.7)
MCV: 93 fL (ref 79–97)
Platelets: 218 10*3/uL (ref 150–450)
RBC: 4.43 x10E6/uL (ref 3.77–5.28)
RDW: 13.2 % (ref 12.3–15.4)
WBC: 4.3 10*3/uL (ref 3.4–10.8)

## 2018-05-08 LAB — LIPID PANEL
Chol/HDL Ratio: 3.5 ratio (ref 0.0–4.4)
Cholesterol, Total: 201 mg/dL — ABNORMAL HIGH (ref 100–199)
HDL: 58 mg/dL (ref >39)
LDL Calculated: 109 mg/dL — ABNORMAL HIGH (ref 0–99)
Triglycerides: 168 mg/dL — ABNORMAL HIGH (ref 0–149)
VLDL Cholesterol Cal: 34 mg/dL (ref 5–40)

## 2018-05-08 LAB — HEMOGLOBIN A1C
Est. average glucose Bld gHb Est-mCnc: 105 mg/dL
Hgb A1c MFr Bld: 5.3 % (ref 4.8–5.6)

## 2018-05-08 LAB — TSH: TSH: 1.36 u[IU]/mL (ref 0.450–4.500)

## 2018-05-08 LAB — VITAMIN D 25 HYDROXY (VIT D DEFICIENCY, FRACTURES): Vit D, 25-Hydroxy: 23.6 ng/mL — ABNORMAL LOW (ref 30.0–100.0)

## 2018-05-08 LAB — FOLLICLE STIMULATING HORMONE: FSH: 78.8 m[IU]/mL

## 2018-05-11 ENCOUNTER — Telehealth: Payer: Self-pay | Admitting: Obstetrics and Gynecology

## 2018-05-11 NOTE — Telephone Encounter (Signed)
Left message to call Sharee Pimple, RN at [facility].   Cytotec for [facility] possible EMB: Place 2 tablets vaginally 6-12 hours prior to procedure

## 2018-05-11 NOTE — Telephone Encounter (Signed)
Patient is calling to confirm the medication directions before her procedure tomorrow.

## 2018-05-11 NOTE — Telephone Encounter (Signed)
Spoke with patient, confirmed cytotec instructions, patient read back instructions and verbalizes understanding.   Encounter closed.

## 2018-05-11 NOTE — Progress Notes (Signed)
GYNECOLOGY  VISIT   HPI: 52 y.o.   Married White or Caucasian Not Hispanic or Latino  female   [phone] with No LMP recorded. (Menstrual status: Irregular Periods).   here for consult following SHGM.  The patient has had PMP spotting. Recent FSH of 78.8, normal TSH  GYNECOLOGIC HISTORY: No LMP recorded. (Menstrual status: Irregular Periods). Contraception: Vasectomy  Menopausal hormone therapy: None        OB History    Gravida  6   Para  3   Term  3   Preterm      AB  2   Living  3     SAB  1   TAB      Ectopic      Multiple      Live Births                 Patient Active Problem List   Diagnosis Date Noted  . History of gestational diabetes 05/01/2017  . Migraine 08/23/2014  . Vitamin D deficiency 08/23/2014  . Perimenopause 08/23/2014    Past Medical History:  Diagnosis Date  . History of gestational diabetes   . Migraine without aura   Normal HgbA1C on 05/07/18  Past Surgical History:  Procedure Laterality Date  . [place] OF UTERUS  1997/1998  . GYNECOLOGIC CRYOSURGERY      Current Outpatient Medications  Medication Sig Dispense Refill  . Multiple Vitamin (MULTIVITAMIN) tablet Take 1 tablet by mouth daily.     No current facility-administered medications for this visit.      ALLERGIES: Patient has no known allergies.  Family History  Problem Relation Age of Onset  . Diabetes Mother   . Melanoma Mother   . Heart disease Father   . Seizures Father   . Colon cancer Paternal Aunt   . Heart disease Maternal Grandmother   . Diabetes Maternal Grandfather   . Cancer Paternal Grandfather     Social History   Socioeconomic History  . Marital status: Married    Spouse name: Not on file  . Number of children: Not on file  . Years of education: Not on file  . Highest education level: Not on file  Occupational History  . Not on file  Social Needs  . Financial resource strain: Not on file  . Food insecurity:    Worry:  Not on file    Inability: Not on file  . Transportation needs:    Medical: Not on file    Non-medical: Not on file  Tobacco Use  . Smoking status: Never Smoker  . Smokeless tobacco: Never Used  Substance and Sexual Activity  . Alcohol use: Yes    Alcohol/week: 2.0 standard drinks    Types: 2 Standard drinks or equivalent per week  . Drug use: No  . Sexual activity: Yes    Partners: Male    Birth control/protection: Surgical    Comment: Vasectomy  Lifestyle  . Physical activity:    Days per week: Not on file    Minutes per session: Not on file  . Stress: Not on file  Relationships  . Social connections:    Talks on phone: Not on file    Gets together: Not on file    Attends religious service: Not on file    Active member of club or organization: Not on file    Attends meetings of clubs or organizations: Not on file    Relationship status: Not on file  .  Intimate partner violence:    Fear of current or ex partner: Not on file    Emotionally abused: Not on file    Physically abused: Not on file    Forced sexual activity: Not on file  Other Topics Concern  . Not on file  Social History Narrative  . Not on file    Review of Systems  Constitutional: Negative.   HENT: Negative.   Eyes: Negative.   Respiratory: Negative.   Cardiovascular: Negative.   Gastrointestinal: Negative.   Endocrine: Negative.   Genitourinary:       Irregular vaginal bleeding  Musculoskeletal: Negative.   Skin: Negative.   Allergic/Immunologic: Negative.   Neurological: Negative.   Hematological: Negative.   Psychiatric/Behavioral: Negative.     PHYSICAL EXAMINATION:    BP 118/76 (BP Location: Right Arm, Patient Position: Sitting, Cuff Size: Normal)   Pulse 78   Wt 161 lb (73 kg)   BMI 26.79 kg/m     General appearance: alert, cooperative and appears stated age Heart: regular rate and rhythm Lungs: CTAB Abdomen: soft, non-tender; bowel sounds normal; no masses,  no  organomegaly Extremities: normal, atraumatic, no cyanosis Skin: normal color, texture and turgor, no rashes or lesions Lymph: normal cervical supraclavicular and inguinal nodes Neurologic: grossly normal    Pelvic: External genitalia:  no lesions              Urethra:  normal appearing urethra with no masses, tenderness or lesions              Bartholins and Skenes: normal                 Vagina: normal appearing vagina with normal color and discharge, no lesions              Cervix:  Stenotic  Attempted Sonohysterogram The procedure and risks of the procedure were reviewed with the patient, consent form was signed. A speculum was placed in the vagina and the cervix was cleansed with betadine. A tenaculum was placed on the cervix. Unable to dilate her cervix with the smallest mini-dilator. The patient was in significant discomfort. The tenaculum and speculum were removed.  Chaperone was present for exam.  ASSESSMENT Postmenopausal spotting Cervical stenosis, unable to dilate in the office (she was pre-treated with cytotec)    PLAN Plan: hysteroscopy, dilation and curettage under ultrasound guidance. Reviewed risks, including: bleeding, infection, uterine perforation, fluid overload, need for further sugery Will pre-treat with cytotec    An After Visit Summary was printed and given to the patient.  ~15 minutes face to face time of which over 50% was spent in counseling.

## 2018-05-12 ENCOUNTER — Ambulatory Visit: Payer: Managed Care, Other (non HMO) | Admitting: Obstetrics and Gynecology

## 2018-05-12 ENCOUNTER — Ambulatory Visit (INDEPENDENT_AMBULATORY_CARE_PROVIDER_SITE_OTHER): Payer: Managed Care, Other (non HMO)

## 2018-05-12 ENCOUNTER — Other Ambulatory Visit: Payer: Self-pay

## 2018-05-12 ENCOUNTER — Encounter: Payer: Self-pay | Admitting: Obstetrics and Gynecology

## 2018-05-12 ENCOUNTER — Other Ambulatory Visit: Payer: Self-pay | Admitting: Obstetrics and Gynecology

## 2018-05-12 VITALS — BP 118/76 | HR 78 | Wt 161.0 lb

## 2018-05-12 DIAGNOSIS — N950 Postmenopausal bleeding: Secondary | ICD-10-CM

## 2018-05-12 DIAGNOSIS — N882 Stricture and stenosis of cervix uteri: Secondary | ICD-10-CM

## 2018-05-12 MED ORDER — MISOPROSTOL 200 MCG PO TABS: ORAL_TABLET | ORAL | 0 refills | Status: DC

## 2018-05-14 ENCOUNTER — Telehealth: Payer: Self-pay | Admitting: Obstetrics and Gynecology

## 2018-05-14 NOTE — Telephone Encounter (Signed)
Spoke with patient regarding benefit for surgery. Patient understood and agreeable. Patient has confirmed and is ready to schedule. She has mentioned she is scheduled for a colonoscopy on 06/25/18. Patient aware this is professional benefit only. Patient aware will be contacted by hospital for separate benefits. Forwarding to Nurse Supervisor for scheduling upon her return on 05/21/18, patient is aware.    Routing to Lamont Snowball, RN

## 2018-05-22 NOTE — Telephone Encounter (Signed)
Call to patient. Per ROI, can leave message on voice mail. Left message calling to assist with scheduling procedure.  Date options of 06-02-18, 06-08-18, 06-15-18 and 06-16-18 left on voice mail. Left message to call back on Monday.

## 2018-05-25 NOTE — Telephone Encounter (Signed)
I've placed the surgery orders, including for intraoperative ultrasound. Please make sure the radiology department is aware. Thanks.

## 2018-05-25 NOTE — H&P (Signed)
GYNECOLOGY  VISIT   HPI: 52 y.o.   Married White or Caucasian Not Hispanic or Latino  female   [phone] with No LMP recorded. (Menstrual status: Irregular Periods).   here for consult following SHGM.  The patient has had PMP spotting. Recent FSH of 78.8, normal TSH  GYNECOLOGIC HISTORY: No LMP recorded. (Menstrual status: Irregular Periods). Contraception: Vasectomy  Menopausal hormone therapy: None                OB History    Gravida  6   Para  3   Term  3   Preterm      AB  2   Living  3     SAB  1   TAB      Ectopic      Multiple      Live Births                     Patient Active Problem List   Diagnosis Date Noted  . History of gestational diabetes 05/01/2017  . Migraine 08/23/2014  . Vitamin D deficiency 08/23/2014  . Perimenopause 08/23/2014        Past Medical History:  Diagnosis Date  . History of gestational diabetes   . Migraine without aura   Normal HgbA1C on 05/07/18       Past Surgical History:  Procedure Laterality Date  . [place] OF UTERUS  1997/1998  . GYNECOLOGIC CRYOSURGERY            Current Outpatient Medications  Medication Sig Dispense Refill  . Multiple Vitamin (MULTIVITAMIN) tablet Take 1 tablet by mouth daily.     No current facility-administered medications for this visit.      ALLERGIES: Patient has no known allergies.       Family History  Problem Relation Age of Onset  . Diabetes Mother   . Melanoma Mother   . Heart disease Father   . Seizures Father   . Colon cancer Paternal Aunt   . Heart disease Maternal Grandmother   . Diabetes Maternal Grandfather   . Cancer Paternal Grandfather     Social History        Socioeconomic History  . Marital status: Married    Spouse name: Not on file  . Number of children: Not on file  . Years of education: Not on file  . Highest education level: Not on file  Occupational History  . Not on file  Social  Needs  . Financial resource strain: Not on file  . Food insecurity:    Worry: Not on file    Inability: Not on file  . Transportation needs:    Medical: Not on file    Non-medical: Not on file  Tobacco Use  . Smoking status: Never Smoker  . Smokeless tobacco: Never Used  Substance and Sexual Activity  . Alcohol use: Yes    Alcohol/week: 2.0 standard drinks    Types: 2 Standard drinks or equivalent per week  . Drug use: No  . Sexual activity: Yes    Partners: Male    Birth control/protection: Surgical    Comment: Vasectomy  Lifestyle  . Physical activity:    Days per week: Not on file    Minutes per session: Not on file  . Stress: Not on file  Relationships  . Social connections:    Talks on phone: Not on file    Gets together: Not on file    Attends  religious service: Not on file    Active member of club or organization: Not on file    Attends meetings of clubs or organizations: Not on file    Relationship status: Not on file  . Intimate partner violence:    Fear of current or ex partner: Not on file    Emotionally abused: Not on file    Physically abused: Not on file    Forced sexual activity: Not on file  Other Topics Concern  . Not on file  Social History Narrative  . Not on file    Review of Systems  Constitutional: Negative.   HENT: Negative.   Eyes: Negative.   Respiratory: Negative.   Cardiovascular: Negative.   Gastrointestinal: Negative.   Endocrine: Negative.   Genitourinary:       Irregular vaginal bleeding  Musculoskeletal: Negative.   Skin: Negative.   Allergic/Immunologic: Negative.   Neurological: Negative.   Hematological: Negative.   Psychiatric/Behavioral: Negative.     PHYSICAL EXAMINATION:    BP 118/76 (BP Location: Right Arm, Patient Position: Sitting, Cuff Size: Normal)   Pulse 78   Wt 161 lb (73 kg)   BMI 26.79 kg/m     General appearance: alert, cooperative and appears stated  age Heart: regular rate and rhythm Lungs: CTAB Abdomen: soft, non-tender; bowel sounds normal; no masses,  no organomegaly Extremities: normal, atraumatic, no cyanosis Skin: normal color, texture and turgor, no rashes or lesions Lymph: normal cervical supraclavicular and inguinal nodes Neurologic: grossly normal    Pelvic: External genitalia:  no lesions              Urethra:  normal appearing urethra with no masses, tenderness or lesions              Bartholins and Skenes: normal                 Vagina: normal appearing vagina with normal color and discharge, no lesions              Cervix:  Stenotic  Attempted Sonohysterogram The procedure and risks of the procedure were reviewed with the patient, consent form was signed. A speculum was placed in the vagina and the cervix was cleansed with betadine. A tenaculum was placed on the cervix. Unable to dilate her cervix with the smallest mini-dilator. The patient was in significant discomfort. The tenaculum and speculum were removed.  Chaperone was present for exam.  ASSESSMENT Postmenopausal spotting Cervical stenosis, unable to dilate in the office (she was pre-treated with cytotec)    PLAN Plan: hysteroscopy, dilation and curettage under ultrasound guidance. Reviewed risks, including: bleeding, infection, uterine perforation, fluid overload, need for further sugery Will pre-treat with cytotec    An After Visit Summary was printed and given to the patient.  ~15 minutes face to face time of which over 50% was spent in counseling.

## 2018-05-25 NOTE — Telephone Encounter (Signed)
Patient called and is interested in doing surgery on 06/02/18. However, she has questions about the recovery time because her [birth year] is [birth year]. She said if the recovery time is about the same as it was after the attempted procedure in the office, she'd like to proceed.  Cc: Dr. Talbert Nan

## 2018-05-25 NOTE — Telephone Encounter (Signed)
Call to patient. Reviewed recovery expectations. Patient desires to proceed on 06-02-18. Surgery instruction sheet reviewed and printed copy will be mailed to patient.   Surgery scheduled for 06-02-18 at [facility] at 1100.  Routing to provider. Encounter closed.  CC: Lerry Liner

## 2018-05-27 NOTE — Telephone Encounter (Signed)
Surgery information sheet and [facility] brochure mailed to patient.

## 2018-05-29 ENCOUNTER — Encounter (HOSPITAL_BASED_OUTPATIENT_CLINIC_OR_DEPARTMENT_OTHER): Payer: Self-pay | Admitting: *Deleted

## 2018-05-29 ENCOUNTER — Other Ambulatory Visit: Payer: Self-pay

## 2018-05-29 NOTE — Progress Notes (Signed)
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

## 2018-06-02 ENCOUNTER — Encounter (HOSPITAL_BASED_OUTPATIENT_CLINIC_OR_DEPARTMENT_OTHER): Payer: Self-pay | Admitting: Anesthesiology

## 2018-06-02 ENCOUNTER — Encounter (HOSPITAL_BASED_OUTPATIENT_CLINIC_OR_DEPARTMENT_OTHER)
Admission: RE | Disposition: A | Discharge status: Home / Self Care | Payer: Self-pay | Source: Ambulatory Visit | Attending: Obstetrics and Gynecology

## 2018-06-02 ENCOUNTER — Ambulatory Visit (HOSPITAL_COMMUNITY): Payer: Managed Care, Other (non HMO)

## 2018-06-02 ENCOUNTER — Other Ambulatory Visit: Payer: Self-pay

## 2018-06-02 ENCOUNTER — Ambulatory Visit (HOSPITAL_BASED_OUTPATIENT_CLINIC_OR_DEPARTMENT_OTHER)
Admission: RE | Admit: 2018-06-02 | Discharge: 2018-06-02 | Disposition: A | Discharge status: Home / Self Care | Payer: Managed Care, Other (non HMO) | Source: Ambulatory Visit | Attending: Obstetrics and Gynecology | Admitting: Obstetrics and Gynecology

## 2018-06-02 ENCOUNTER — Ambulatory Visit (HOSPITAL_BASED_OUTPATIENT_CLINIC_OR_DEPARTMENT_OTHER): Payer: Managed Care, Other (non HMO) | Admitting: Anesthesiology

## 2018-06-02 DIAGNOSIS — N882 Stricture and stenosis of cervix uteri: Secondary | ICD-10-CM | POA: Diagnosis not present

## 2018-06-02 DIAGNOSIS — N950 Postmenopausal bleeding: Secondary | ICD-10-CM | POA: Diagnosis present

## 2018-06-02 DIAGNOSIS — G43909 Migraine, unspecified, not intractable, without status migrainosus: Secondary | ICD-10-CM | POA: Insufficient documentation

## 2018-06-02 DIAGNOSIS — Z780 Asymptomatic menopausal state: Secondary | ICD-10-CM

## 2018-06-02 HISTORY — PX: DILATATION & CURETTAGE/HYSTEROSCOPY WITH MYOSURE: SHX6511

## 2018-06-02 HISTORY — PX: OPERATIVE ULTRASOUND: SHX5996

## 2018-06-02 LAB — POCT PREGNANCY, URINE: Preg Test, Ur: NEGATIVE

## 2018-06-02 SURGERY — DILATATION & CURETTAGE/HYSTEROSCOPY WITH MYOSURE
Anesthesia: General

## 2018-06-02 MED ORDER — KETOROLAC TROMETHAMINE 30 MG/ML IJ SOLN
INTRAMUSCULAR | Status: AC
  Filled 2018-06-02: qty 1

## 2018-06-02 MED ORDER — KETOROLAC TROMETHAMINE 30 MG/ML IJ SOLN
INTRAMUSCULAR | Status: DC | PRN
  Administered 2018-06-02: 30 mg via INTRAVENOUS

## 2018-06-02 MED ORDER — LIDOCAINE 2% (20 MG/ML) 5 ML SYRINGE
INTRAMUSCULAR | Status: DC | PRN
  Administered 2018-06-02: 100 mg via INTRAVENOUS

## 2018-06-02 MED ORDER — ONDANSETRON HCL 4 MG/2ML IJ SOLN
4.0000 mg | Freq: Once | INTRAMUSCULAR | Status: DC | PRN
  Filled 2018-06-02: qty 2

## 2018-06-02 MED ORDER — FENTANYL CITRATE (PF) 100 MCG/2ML IJ SOLN
INTRAMUSCULAR | Status: AC
  Filled 2018-06-02: qty 2

## 2018-06-02 MED ORDER — MIDAZOLAM HCL 2 MG/2ML IJ SOLN
INTRAMUSCULAR | Status: AC
  Filled 2018-06-02: qty 2

## 2018-06-02 MED ORDER — FENTANYL CITRATE (PF) 100 MCG/2ML IJ SOLN
INTRAMUSCULAR | Status: DC | PRN
  Administered 2018-06-02 (×2): 50 ug via INTRAVENOUS

## 2018-06-02 MED ORDER — OXYCODONE HCL 5 MG/5ML PO SOLN
5.0000 mg | Freq: Once | ORAL | Status: DC | PRN
  Filled 2018-06-02: qty 5

## 2018-06-02 MED ORDER — FENTANYL CITRATE (PF) 100 MCG/2ML IJ SOLN
25.0000 ug | INTRAMUSCULAR | Status: DC | PRN
  Administered 2018-06-02: 25 ug via INTRAVENOUS
  Administered 2018-06-02: 50 ug via INTRAVENOUS
  Filled 2018-06-02: qty 1

## 2018-06-02 MED ORDER — LIDOCAINE 2% (20 MG/ML) 5 ML SYRINGE
INTRAMUSCULAR | Status: AC
  Filled 2018-06-02: qty 5

## 2018-06-02 MED ORDER — DEXAMETHASONE SODIUM PHOSPHATE 4 MG/ML IJ SOLN
INTRAMUSCULAR | Status: DC | PRN
  Administered 2018-06-02: 10 mg via INTRAVENOUS

## 2018-06-02 MED ORDER — LACTATED RINGERS IV SOLN
INTRAVENOUS | Status: DC
  Administered 2018-06-02: 12:00:00 via INTRAVENOUS
  Filled 2018-06-02: qty 1000

## 2018-06-02 MED ORDER — OXYCODONE HCL 5 MG PO TABS
5.0000 mg | ORAL_TABLET | Freq: Once | ORAL | Status: DC | PRN
  Filled 2018-06-02: qty 1

## 2018-06-02 MED ORDER — PROPOFOL 10 MG/ML IV BOLUS
INTRAVENOUS | Status: DC | PRN
  Administered 2018-06-02: 200 mg via INTRAVENOUS

## 2018-06-02 MED ORDER — DEXAMETHASONE SODIUM PHOSPHATE 10 MG/ML IJ SOLN
INTRAMUSCULAR | Status: AC
  Filled 2018-06-02: qty 1

## 2018-06-02 MED ORDER — SODIUM CHLORIDE 0.9 % IR SOLN
Status: DC | PRN
  Administered 2018-06-02: 1000 mL

## 2018-06-02 MED ORDER — PROPOFOL 10 MG/ML IV BOLUS
INTRAVENOUS | Status: AC
  Filled 2018-06-02: qty 40

## 2018-06-02 MED ORDER — ONDANSETRON HCL 4 MG/2ML IJ SOLN
INTRAMUSCULAR | Status: DC | PRN
  Administered 2018-06-02: 4 mg via INTRAVENOUS

## 2018-06-02 MED ORDER — LACTATED RINGERS IV SOLN
INTRAVENOUS | Status: DC
  Administered 2018-06-02: 1000 mL via INTRAVENOUS
  Filled 2018-06-02: qty 1000

## 2018-06-02 MED ORDER — MIDAZOLAM HCL 5 MG/5ML IJ SOLN
INTRAMUSCULAR | Status: DC | PRN
  Administered 2018-06-02: 2 mg via INTRAVENOUS

## 2018-06-02 MED ORDER — ONDANSETRON HCL 4 MG/2ML IJ SOLN
INTRAMUSCULAR | Status: AC
  Filled 2018-06-02: qty 2

## 2018-06-02 SURGICAL SUPPLY — 23 items
CANISTER SUCT 3000ML PPV (MISCELLANEOUS) ×4 IMPLANT
CATH ROBINSON RED A/P 16FR (CATHETERS) IMPLANT
DEVICE MYOSURE LITE (MISCELLANEOUS) IMPLANT
DEVICE MYOSURE REACH (MISCELLANEOUS) IMPLANT
DILATOR CANAL MILEX (MISCELLANEOUS) IMPLANT
GAUZE 4X4 16PLY RFD (DISPOSABLE) ×2 IMPLANT
GLOVE BIO SURGEON STRL SZ 6.5 (GLOVE) ×2 IMPLANT
GOWN STRL REUS W/TWL LRG LVL3 (GOWN DISPOSABLE) ×2 IMPLANT
IV NS IRRIG 3000ML ARTHROMATIC (IV SOLUTION) ×2 IMPLANT
KIT PROCEDURE FLUENT (KITS) ×2 IMPLANT
KIT TURNOVER CYSTO (KITS) ×2 IMPLANT
MYOSURE XL FIBROID (MISCELLANEOUS)
PACK VAGINAL MINOR WOMEN LF (CUSTOM PROCEDURE TRAY) ×2 IMPLANT
PAD OB MATERNITY 4.3X12.25 (PERSONAL CARE ITEMS) ×2 IMPLANT
PAD PREP 24X48 CUFFED NSTRL (MISCELLANEOUS) ×2 IMPLANT
PENCIL BUTTON HOLSTER BLD 10FT (ELECTRODE) ×1 IMPLANT
SEAL ROD LENS SCOPE MYOSURE (ABLATOR) ×2 IMPLANT
SYR 20CC LL (SYRINGE) IMPLANT
SYSTEM TISS REMOVAL MYOSURE XL (MISCELLANEOUS) IMPLANT
TOWEL OR 17X24 6PK STRL BLUE (TOWEL DISPOSABLE) ×4 IMPLANT
TUBE CONNECTING 12X1/4 (SUCTIONS) ×1 IMPLANT
WATER STERILE IRR 500ML POUR (IV SOLUTION) IMPLANT
YANKAUER SUCT BULB TIP NO VENT (SUCTIONS) ×1 IMPLANT

## 2018-06-02 NOTE — Anesthesia Preprocedure Evaluation (Addendum)
Anesthesia Evaluation  Patient identified by MRN, date of birth, ID band Patient awake    Reviewed: Allergy & Precautions, NPO status , Patient's Chart, lab work & pertinent test results  History of Anesthesia Complications Negative for: history of anesthetic complications  Airway Mallampati: III  TM Distance: >3 FB Neck ROM: Full    Dental no notable dental hx.    Pulmonary neg pulmonary ROS,    Pulmonary exam normal        Cardiovascular negative cardio ROS Normal cardiovascular exam     Neuro/Psych  Headaches, negative psych ROS   GI/Hepatic negative GI ROS, Neg liver ROS,   Endo/Other  negative endocrine ROS  Renal/GU negative Renal ROS  negative genitourinary   Musculoskeletal negative musculoskeletal ROS (+)   Abdominal   Peds  Hematology negative hematology ROS (+)   Anesthesia Other Findings   Reproductive/Obstetrics negative OB ROS                            Anesthesia Physical Anesthesia Plan  ASA: I  Anesthesia Plan: General   Post-op Pain Management:    Induction: Intravenous  PONV Risk Score and Plan: 4 or greater and Ondansetron, Dexamethasone, Midazolam and Treatment may vary due to age or medical condition  Airway Management Planned: LMA  Additional Equipment: None  Intra-op Plan:   Post-operative Plan: Extubation in OR  Informed Consent: I have reviewed the patients History and Physical, chart, labs and discussed the procedure including the risks, benefits and alternatives for the proposed anesthesia with the patient or authorized representative who has indicated his/her understanding and acceptance.     Plan Discussed with:   Anesthesia Plan Comments:        Anesthesia Quick Evaluation

## 2018-06-02 NOTE — Anesthesia Postprocedure Evaluation (Signed)
Anesthesia Post Note  Patient: Tammy Terrell  Procedure(s) Performed: DILATATION & CURETTAGE/HYSTEROSCOPY (N/A ) OPERATIVE ULTRASOUND (N/A )     Patient location during evaluation: PACU Anesthesia Type: General Level of consciousness: awake and alert Pain management: pain level controlled Vital Signs Assessment: post-procedure vital signs reviewed and stable Respiratory status: spontaneous breathing, nonlabored ventilation and respiratory function stable Cardiovascular status: blood pressure returned to baseline and stable Postop Assessment: no apparent nausea or vomiting Anesthetic complications: no    Last Vitals:  Vitals:   06/02/18 1230 06/02/18 1245  BP: 137/78 139/81  Pulse: 66 74  Resp: 10 10  Temp:    SpO2: 98% 96%    Last Pain:  Vitals:   06/02/18 1255  TempSrc:   PainSc: 3                  Lidia Collum

## 2018-06-02 NOTE — Transfer of Care (Signed)
Immediate Anesthesia Transfer of Care Note  Patient: Tammy Terrell  Procedure(s) Performed: DILATATION & CURETTAGE/HYSTEROSCOPY WITH MYOSURE (N/A ) OPERATIVE ULTRASOUND (N/A )  Patient Location: PACU  Anesthesia Type:General  Level of Consciousness: awake, alert  and oriented  Airway & Oxygen Therapy: Patient Spontanous Breathing and Patient connected to nasal cannula oxygen  Post-op Assessment: Report given to RN  Post vital signs: Reviewed and stable  Last Vitals: 135/83, 81, 12, 100% Vitals Value Taken Time  BP    Temp    Pulse    Resp    SpO2      Last Pain:  Vitals:   06/02/18 0946  TempSrc:   PainSc: 2       Patients Stated Pain Goal: 5 (87/21/58 7276)  Complications: No apparent anesthesia complications

## 2018-06-02 NOTE — Interval H&P Note (Signed)
History and Physical Interval Note:  06/02/2018 10:52 AM  Tammy Terrell  has presented today for surgery, with the diagnosis of PMB, stenotic cervix  The various methods of treatment have been discussed with the patient and family. After consideration of risks, benefits and other options for treatment, the patient has consented to  Procedure(s) with comments: [place] (N/A) - With ultrasound guidance OPERATIVE ULTRASOUND (N/A) as a surgical intervention .  The patient's history has been reviewed, patient examined, no change in status, stable for surgery.  I have reviewed the patient's chart and labs.  Questions were answered to the patient's satisfaction.     Salvadore Dom

## 2018-06-02 NOTE — Discharge Instructions (Signed)
DISCHARGE INSTRUCTIONS: HYSTEROSCOPY  The following instructions have been prepared to help you care for yourself upon your return home.    May take Ibuprofen after 5:30pm today.    Personal hygiene:  Use sanitary pads for vaginal drainage, not tampons.  Shower the day after your procedure.  NO tub baths, pools or Jacuzzis for 2-3 weeks.  Wipe front to back after using the bathroom.  Activity and limitations:  Do NOT drive or operate any equipment for 24 hours. The effects of anesthesia are still present and drowsiness may result.  Do NOT rest in bed all day.  Walking is encouraged.  Walk up and down stairs slowly.  You may resume your normal activity in one to two days or as indicated by your physician. Sexual activity: NO intercourse for at least 2 weeks after the procedure, or as indicated by your Doctor.  Diet: Eat a light meal as desired this evening. You may resume your usual diet tomorrow.  Return to Work: You may resume your work activities in one to two days or as indicated by Marine scientist.  What to expect after your surgery: Expect to have vaginal bleeding/discharge for 2-3 days and spotting for up to 10 days. It is not unusual to have soreness for up to 1-2 weeks. You may have a slight burning sensation when you urinate for the first day. Mild cramps may continue for a couple of days. You may have a regular period in 2-6 weeks.  Call your doctor for any of the following:  Excessive vaginal bleeding or clotting, saturating and changing one pad every hour.  Inability to urinate 6 hours after discharge from hospital.  Pain not relieved by pain medication.  Fever of 100.4 F or greater.  Unusual vaginal discharge or odor.  Return to office _________________Call for an appointment ___________________ Patients signature: ______________________ Nurses signature ________________________  Post Anesthesia Care Unit [phone]    Post Anesthesia Home  Care Instructions  Activity: Get plenty of rest for the remainder of the day. A responsible individual must stay with you for 24 hours following the procedure.  For the next 24 hours, DO NOT: -Drive a car -Paediatric nurse -Drink alcoholic beverages -Take any medication unless instructed by your physician -Make any legal decisions or sign important papers.  Meals: Start with liquid foods such as gelatin or soup. Progress to regular foods as tolerated. Avoid greasy, spicy, heavy foods. If nausea and/or vomiting occur, drink only clear liquids until the nausea and/or vomiting subsides. Call your physician if vomiting continues.  Special Instructions/Symptoms: Your throat may feel dry or sore from the anesthesia or the breathing tube placed in your throat during surgery. If this causes discomfort, gargle with warm salt water. The discomfort should disappear within 24 hours.  If you had a scopolamine patch placed behind your ear for the management of post- operative nausea and/or vomiting:  1. The medication in the patch is effective for 72 hours, after which it should be removed.  Wrap patch in a tissue and discard in the trash. Wash hands thoroughly with soap and water. 2. You may remove the patch earlier than 72 hours if you experience unpleasant side effects which may include dry mouth, dizziness or visual disturbances. 3. Avoid touching the patch. Wash your hands with soap and water after contact with the patch.   Call your surgeon if you experience:   1.  Fever over 101.0. 2.  Inability to urinate. 3.  Nausea and/or vomiting. 4.  Extreme swelling or bruising at the surgical site. 5.  Continued bleeding from the incision. 6.  Increased pain, redness or drainage from the incision. 7.  Problems related to your pain medication. 8.  Any problems and/or concerns

## 2018-06-02 NOTE — Anesthesia Procedure Notes (Signed)
Procedure Name: LMA Insertion Date/Time: 06/02/2018 11:06 AM Performed by: Lidia Collum, MD Pre-anesthesia Checklist: Patient identified, Emergency Drugs available, Suction available and Patient being monitored Patient Re-evaluated:Patient Re-evaluated prior to induction Oxygen Delivery Method: Circle system utilized Preoxygenation: Pre-oxygenation with 100% oxygen Induction Type: IV induction Ventilation: Mask ventilation without difficulty LMA: LMA inserted LMA Size: 4.0 Number of attempts: 1 Airway Equipment and Method: Bite block Placement Confirmation: positive ETCO2 Tube secured with: Tape Dental Injury: Teeth and Oropharynx as per pre-operative assessment

## 2018-06-02 NOTE — Op Note (Signed)
Preoperative Diagnosis: Postmenopausal bleeding, stenotic cervical os  Postoperative Diagnosis: Same  Procedure: Hysteroscopy, dilation and curettage under ultrasound guidance  Surgeon: Dr Sumner Boast  Assistants: None  Anesthesia: General via LMA  EBL: 5 cc  Fluids: 800 cc LR  Fluid deficit: 135 cc  Urine output: not recorded  Indications for surgery: The patient is a 52 yo female, who presented with postmenopausal bleeding. Work up included an [place] of 78.8, an ultrasound with a slightly thickened endometrium. Unable to perform sonohysterogram secondary to cervical stenosis.  The risks of the surgery were reviewed with the patient and the consent form was signed prior to her surgery.  Findings: EUA: normal sized, mobile uterus, no adnexal masses. Hysteroscopy performed with ultrasound guidance, slight uterine adhesions in the right cornual region, otherwise normal and atrophic appearing endometrium.   Specimens: Endometrial curettings   Procedure: The patient was taken to the operating room with an IV in place. She was placed in the dorsal lithotomy position and anesthesia was administered. She was prepped and draped in the usual sterile fashion for a vaginal procedure. Her bladder was filled with a few hundred cc of NS. A weighted speculum was placed in the vagina and a single tooth tenaculum was placed on the anterior lip of the cervix. The cervix was so stenotic a dilator could not be passed. The cervical dimple was incised with a #11 blade. The cervix was then dilated to a #8 hagar dilator under ultrasound guidance. The uterus was sounded to 5-6 cm. The myosure hysteroscope was inserted into the uterine cavity. With continuous infusion of normal saline, the uterine cavity was visualized with the above findings. The myosure was then removed. The cavity was then curetted with the small sharp curette. The cavity had the characteristically gritty texture at the end of the procedure. The  curette and the single tooth tenaculum were removed. Oozing from the tenaculum site was stopped with pressure. Slight cervical oozing from where the cervix was incised was cauterized. Hemostasis was excellent. The speculum was removed. The patients perineum was cleansed of betadine and she was taken out of the dorsal lithotomy position.  Upon awakening the LMA was removed and the patient was transferred to the recovery room in stable and awake condition.  The sponge and instrument count were correct. There were no complications.

## 2018-06-03 ENCOUNTER — Encounter (HOSPITAL_BASED_OUTPATIENT_CLINIC_OR_DEPARTMENT_OTHER): Payer: Self-pay | Admitting: Obstetrics and Gynecology

## 2018-06-11 ENCOUNTER — Ambulatory Visit (AMBULATORY_SURGERY_CENTER): Payer: Self-pay | Admitting: *Deleted

## 2018-06-11 ENCOUNTER — Encounter: Payer: Self-pay | Admitting: Internal Medicine

## 2018-06-11 VITALS — Ht 65.5 in | Wt 163.0 lb

## 2018-06-11 DIAGNOSIS — Z1211 Encounter for screening for malignant neoplasm of colon: Secondary | ICD-10-CM

## 2018-06-11 NOTE — Progress Notes (Signed)
Patient denies any allergies to eggs or soy. Patient denies any problems with anesthesia/sedation. Patient denies any oxygen use at home. Patient denies taking any diet/weight loss medications or blood thinners. EMMI education assisgned to patient on colonoscopy, this was explained and instructions given to patient. 

## 2018-06-16 NOTE — Progress Notes (Signed)
GYNECOLOGY  VISIT   HPI: 52 y.o.   Married White or Caucasian Not Hispanic or Latino  female   [phone] with No LMP recorded. Patient is perimenopausal.   here for 2 week post op. She is s/p hysteroscopy, D&C under ultrasound guidance for PMP spotting and a stenotic cervix. Pathology with inactive endometrium. Slight bleeding after surgery. Feeling well.  She c/o hot flashes and night sweats. Perimenopausal.  She is up 3-4 x a night with sweats, 5-6 hot flashes a day. Overall feels she is getting enough sleep.   GYNECOLOGIC HISTORY: No LMP recorded. Patient is perimenopausal. Contraception: Spouse has a vasectomy Menopausal hormone therapy: None        OB History    Gravida  6   Para  3   Term  3   Preterm      AB  2   Living  3     SAB  1   TAB      Ectopic      Multiple      Live Births                 Patient Active Problem List   Diagnosis Date Noted  . History of gestational diabetes 05/01/2017  . Migraine 08/23/2014  . Vitamin D deficiency 08/23/2014  . Perimenopause 08/23/2014    Past Medical History:  Diagnosis Date  . History of gestational diabetes   . Migraine without aura     Past Surgical History:  Procedure Laterality Date  . DILATATION & CURETTAGE/HYSTEROSCOPY WITH MYOSURE N/A 06/02/2018   Procedure: DILATATION & CURETTAGE/HYSTEROSCOPY;  Surgeon: Salvadore Dom, MD;  Location: [facility];  Service: Gynecology;  Laterality: N/A;  With ultrasound guidance  . [place] OF UTERUS  1997/1998  . GYNECOLOGIC CRYOSURGERY    . OPERATIVE ULTRASOUND N/A 06/02/2018   Procedure: OPERATIVE ULTRASOUND;  Surgeon: Salvadore Dom, MD;  Location: [facility];  Service: Gynecology;  Laterality: N/A;    Current Outpatient Medications  Medication Sig Dispense Refill  . ibuprofen (ADVIL,MOTRIN) 200 MG tablet Take 400 mg by mouth as needed.    . Multiple Vitamin (MULTIVITAMIN) tablet Take 1 tablet by  mouth daily.    Marland Kitchen VITAMIN D PO Take by mouth.     No current facility-administered medications for this visit.      ALLERGIES: Patient has no known allergies.  Family History  Problem Relation Age of Onset  . Diabetes Mother   . Melanoma Mother   . Heart disease Father   . Seizures Father   . Colon cancer Paternal Aunt        60's  . Heart disease Maternal Grandmother   . Diabetes Maternal Grandfather   . Cancer Paternal Grandfather   . Esophageal cancer Paternal Aunt   . Stomach cancer Neg Hx   . Ulcerative colitis Neg Hx   . Colon polyps Neg Hx     Social History   Socioeconomic History  . Marital status: Married    Spouse name: Not on file  . Number of children: Not on file  . Years of education: Not on file  . Highest education level: Not on file  Occupational History  . Not on file  Social Needs  . Financial resource strain: Not on file  . Food insecurity:    Worry: Not on file    Inability: Not on file  . Transportation needs:    Medical: Not on file  Non-medical: Not on file  Tobacco Use  . Smoking status: Never Smoker  . Smokeless tobacco: Never Used  Substance and Sexual Activity  . Alcohol use: Yes    Alcohol/week: 2.0 standard drinks    Types: 2 Glasses of wine per week    Comment: OCC  . Drug use: No  . Sexual activity: Yes    Partners: Male    Birth control/protection: Surgical    Comment: Vasectomy  Lifestyle  . Physical activity:    Days per week: Not on file    Minutes per session: Not on file  . Stress: Not on file  Relationships  . Social connections:    Talks on phone: Not on file    Gets together: Not on file    Attends religious service: Not on file    Active member of club or organization: Not on file    Attends meetings of clubs or organizations: Not on file    Relationship status: Not on file  . Intimate partner violence:    Fear of current or ex partner: Not on file    Emotionally abused: Not on file    Physically  abused: Not on file    Forced sexual activity: Not on file  Other Topics Concern  . Not on file  Social History Narrative  . Not on file    Review of Systems  Constitutional: Negative.   HENT: Negative.   Eyes: Negative.   Respiratory: Negative.   Cardiovascular: Negative.   Gastrointestinal: Negative.   Genitourinary: Negative.   Musculoskeletal: Negative.   Skin: Negative.   Neurological: Negative.   Endo/Heme/Allergies: Negative.   Psychiatric/Behavioral: Negative.     PHYSICAL EXAMINATION:    BP 114/80 (BP Location: Right Arm, Patient Position: Sitting, Cuff Size: Normal)   Pulse 84   Wt 166 lb 3.2 oz (75.4 kg)   BMI 27.24 kg/m     General appearance: alert, cooperative and appears stated age Abdomen: soft, non-tender; non distended, no masses,  no organomegaly  Reviewed pictures from the hysteroscopy  ASSESSMENT Perimenopausal bleeding, negative hysteroscopy, d&c, atrophic endometrium on pathology Vasomotor symptoms    PLAN She will call with further bleeding Discussed OTC treatment and behavioral changes for vasomotor symptoms, she will call if she feels she needs to do something else   An After Visit Summary was printed and given to the patient.

## 2018-06-17 ENCOUNTER — Encounter: Payer: Self-pay | Admitting: Obstetrics and Gynecology

## 2018-06-17 ENCOUNTER — Ambulatory Visit (INDEPENDENT_AMBULATORY_CARE_PROVIDER_SITE_OTHER): Payer: Managed Care, Other (non HMO) | Admitting: Obstetrics and Gynecology

## 2018-06-17 ENCOUNTER — Other Ambulatory Visit: Payer: Self-pay

## 2018-06-17 VITALS — BP 114/80 | HR 84 | Wt 166.2 lb

## 2018-06-17 DIAGNOSIS — R61 Generalized hyperhidrosis: Secondary | ICD-10-CM

## 2018-06-17 DIAGNOSIS — R232 Flushing: Secondary | ICD-10-CM | POA: Diagnosis not present

## 2018-06-17 DIAGNOSIS — Z9889 Other specified postprocedural states: Secondary | ICD-10-CM

## 2018-06-17 NOTE — Patient Instructions (Signed)
estroven or estroven pm for hot flashes and night sweats.

## 2018-06-25 ENCOUNTER — Encounter: Payer: Self-pay | Admitting: Internal Medicine

## 2018-06-25 ENCOUNTER — Ambulatory Visit (AMBULATORY_SURGERY_CENTER): Payer: Managed Care, Other (non HMO) | Admitting: Internal Medicine

## 2018-06-25 VITALS — BP 109/65 | HR 79 | Temp 98.2°F | Resp 13 | Ht 65.0 in | Wt 166.0 lb

## 2018-06-25 DIAGNOSIS — Z1211 Encounter for screening for malignant neoplasm of colon: Secondary | ICD-10-CM | POA: Diagnosis present

## 2018-06-25 DIAGNOSIS — D125 Benign neoplasm of sigmoid colon: Secondary | ICD-10-CM | POA: Diagnosis not present

## 2018-06-25 DIAGNOSIS — D122 Benign neoplasm of ascending colon: Secondary | ICD-10-CM

## 2018-06-25 DIAGNOSIS — K635 Polyp of colon: Secondary | ICD-10-CM

## 2018-06-25 MED ORDER — SODIUM CHLORIDE 0.9 % IV SOLN: 500.0000 mL | Freq: Once | INTRAVENOUS | Status: AC

## 2018-06-25 NOTE — Patient Instructions (Addendum)
   I found and removed 2 small polyps that look benign. I will let you know pathology results and when to have another routine colonoscopy by mail and/or My Chart.   I appreciate the opportunity to care for you. Gatha Mayer, MD, FACG YOU HAD AN ENDOSCOPIC PROCEDURE TODAY AT [facility]:   Refer to the procedure report that was given to you for any specific questions about what was found during the examination.  If the procedure report does not answer your questions, please call your gastroenterologist to clarify.  If you requested that your care partner not be given the details of your procedure findings, then the procedure report has been included in a sealed envelope for you to review at your convenience later.  YOU SHOULD EXPECT: Some feelings of bloating in the abdomen. Passage of more gas than usual.  Walking can help get rid of the air that was put into your GI tract during the procedure and reduce the bloating. If you had a lower endoscopy (such as a colonoscopy or flexible sigmoidoscopy) you may notice spotting of blood in your stool or on the toilet paper. If you underwent a bowel prep for your procedure, you may not have a normal bowel movement for a few days.  Please Note:  You might notice some irritation and congestion in your nose or some drainage.  This is from the oxygen used during your procedure.  There is no need for concern and it should clear up in a day or so.  SYMPTOMS TO REPORT IMMEDIATELY:   Following lower endoscopy (colonoscopy or flexible sigmoidoscopy):  Excessive amounts of blood in the stool  Significant tenderness or worsening of abdominal pains  Swelling of the abdomen that is new, acute  Fever of 100F or higher  For urgent or emergent issues, a gastroenterologist can be reached at any hour by calling [phone].   DIET:  We do recommend a small meal at first, but then you may proceed to your regular diet.  Drink plenty of  fluids but you should avoid alcoholic beverages for 24 hours.  ACTIVITY:  You should plan to take it easy for the rest of today and you should NOT DRIVE or use heavy machinery until tomorrow (because of the sedation medicines used during the test).    FOLLOW UP: Our staff will call the number listed on your records the next business day following your procedure to check on you and address any questions or concerns that you may have regarding the information given to you following your procedure. If we do not reach you, we will leave a message.  However, if you are feeling well and you are not experiencing any problems, there is no need to return our call.  We will assume that you have returned to your regular daily activities without incident.  If any biopsies were taken you will be contacted by phone or by letter within the next 1-3 weeks.  Please call us at [phone] if you have not heard about the biopsies in 3 weeks.   Await for biopsy results Polyps (handout given)  SIGNATURES/CONFIDENTIALITY: You and/or your care partner have signed paperwork which will be entered into your electronic medical record.  These signatures attest to the fact that that the information above on your After Visit Summary has been reviewed and is understood.  Full responsibility of the confidentiality of this discharge information lies with you and/or your care-partner.

## 2018-06-25 NOTE — Op Note (Addendum)
[facility] Patient Name: Tammy Terrell Procedure Date: 06/25/2018 12:10 PM MRN: [id number] Endoscopist: Gatha Mayer , MD Age: 53 Referring MD:  Date of Birth: August 11, 1965 Gender: Female Account #: [id number] Procedure:                Colonoscopy Indications:              Screening for colorectal malignant neoplasm, This                            is the patient's first colonoscopy Medicines:                Propofol per Anesthesia, Monitored Anesthesia Care Procedure:                Pre-Anesthesia Assessment:                           - Prior to the procedure, a History and Physical                            was performed, and patient medications and                            allergies were reviewed. The patient's tolerance of                            previous anesthesia was also reviewed. The risks                            and benefits of the procedure and the sedation                            options and risks were discussed with the patient.                            All questions were answered, and informed consent                            was obtained. Prior Anticoagulants: The patient has                            taken no previous anticoagulant or antiplatelet                            agents. ASA Grade Assessment: II - A patient with                            mild systemic disease. After reviewing the risks                            and benefits, the patient was deemed in                            satisfactory condition to undergo the procedure.  After obtaining informed consent, the colonoscope                            was passed under direct vision. Throughout the                            procedure, the patient's blood pressure, pulse, and                            oxygen saturations were monitored continuously. The                            Colonoscope was introduced through the anus and   advanced to the the cecum, identified by                            appendiceal orifice and ileocecal valve. The                            colonoscopy was performed without difficulty. The                            patient tolerated the procedure well. The quality                            of the bowel preparation was excellent. The bowel                            preparation used was Miralax. The ileocecal valve,                            appendiceal orifice, and rectum were photographed. Scope In: 12:26:06 PM Scope Out: 12:46:05 PM Scope Withdrawal Time: 0 hours 15 minutes 34 seconds  Total Procedure Duration: 0 hours 19 minutes 59 seconds  Findings:                 Two sessile polyps were found in the sigmoid colon                            and ascending colon. The polyps were diminutive in                            size. These polyps were removed with a cold snare.                            Resection and retrieval were complete. Verification                            of patient identification for the specimen was                            done. Estimated blood loss was minimal.  The exam was otherwise without abnormality on                            direct and retroflexion views. Complications:            No immediate complications. Estimated Blood Loss:     Estimated blood loss was minimal. Impression:               - Two diminutive polyps in the sigmoid colon and in                            the ascending colon, removed with a cold snare.                            Resected and retrieved.                           - The examination was otherwise normal on direct                            and retroflexion views. Recommendation:           - Patient has a contact number available for                            emergencies. The signs and symptoms of potential                            delayed complications were discussed with the                             patient. Return to normal activities tomorrow.                            Written discharge instructions were provided to the                            patient.                           - Resume previous diet.                           - Continue present medications.                           - Repeat colonoscopy is recommended. The                            colonoscopy date will be determined after pathology                            results from today's exam become available for                            review. Gatha Mayer, MD 06/25/2018 12:49:24 PM This report has been  signed electronically.

## 2018-06-25 NOTE — Progress Notes (Signed)
A/ox3 pleased with MAC, report to RN 

## 2018-06-25 NOTE — Progress Notes (Signed)
Called to room to assist during endoscopic procedure.  Patient ID and intended procedure confirmed with present staff. Received instructions for my participation in the procedure from the performing physician.  

## 2018-06-26 ENCOUNTER — Telehealth: Payer: Self-pay

## 2018-06-26 NOTE — Telephone Encounter (Signed)
  Follow up Call-  Call back number 06/25/2018  Post procedure Call Back phone  # [phone]  Permission to leave phone message Yes  Some recent data might be hidden     Patient questions:  Do you have a fever, pain , or abdominal swelling? No. Pain Score  0 *  Have you tolerated food without any problems? Yes.    Have you been able to return to your normal activities? Yes.    Do you have any questions about your discharge instructions: Diet   No. Medications  No. Follow up visit  No.  Do you have questions or concerns about your Care? No.  Actions: * If pain score is 4 or above: No action needed, pain <4.

## 2018-07-05 ENCOUNTER — Encounter: Payer: Self-pay | Admitting: Internal Medicine

## 2018-07-05 DIAGNOSIS — Z86010 Personal history of colonic polyps: Secondary | ICD-10-CM | POA: Insufficient documentation

## 2018-07-05 DIAGNOSIS — Z860101 Personal history of adenomatous and serrated colon polyps: Secondary | ICD-10-CM

## 2018-07-05 HISTORY — DX: Personal history of adenomatous and serrated colon polyps: Z86.0101

## 2018-07-05 HISTORY — DX: Personal history of colonic polyps: Z86.010

## 2018-07-05 NOTE — Progress Notes (Signed)
2 adenomas recall 2024 My Chart letter

## 2018-12-11 ENCOUNTER — Other Ambulatory Visit: Payer: Self-pay | Admitting: Obstetrics and Gynecology

## 2018-12-11 ENCOUNTER — Telehealth: Payer: Self-pay | Admitting: Obstetrics and Gynecology

## 2018-12-11 DIAGNOSIS — Z1231 Encounter for screening mammogram for malignant neoplasm of breast: Secondary | ICD-10-CM

## 2018-12-11 NOTE — Telephone Encounter (Signed)
Spoke with patient. Patient is postmenopausal. Reports increase in hot flashes, weight gain and thinning hair. Has tried OTC estroven with no relief. Requesting WebEx visit to discuss HRT options. WebEx scheduled for 5/11 at 11:30am with Dr. Talbert Nan. Confirmed e-mail on file, instructions reviewed. Patient verbalizes understanding.   Routing to provider for final review. Patient is agreeable to disposition. Will close encounter.

## 2018-12-11 NOTE — Telephone Encounter (Signed)
Patient is having menopausal issues she would like to discuss.

## 2018-12-14 ENCOUNTER — Encounter: Payer: Self-pay | Admitting: Obstetrics and Gynecology

## 2018-12-14 ENCOUNTER — Other Ambulatory Visit: Payer: Self-pay

## 2018-12-14 ENCOUNTER — Ambulatory Visit (INDEPENDENT_AMBULATORY_CARE_PROVIDER_SITE_OTHER): Payer: Managed Care, Other (non HMO) | Admitting: Obstetrics and Gynecology

## 2018-12-14 DIAGNOSIS — R61 Generalized hyperhidrosis: Secondary | ICD-10-CM

## 2018-12-14 DIAGNOSIS — R6882 Decreased libido: Secondary | ICD-10-CM | POA: Diagnosis not present

## 2018-12-14 DIAGNOSIS — G479 Sleep disorder, unspecified: Secondary | ICD-10-CM | POA: Diagnosis not present

## 2018-12-14 DIAGNOSIS — R232 Flushing: Secondary | ICD-10-CM

## 2018-12-14 DIAGNOSIS — R4586 Emotional lability: Secondary | ICD-10-CM

## 2018-12-14 MED ORDER — ESTRADIOL 0.025 MG/24HR TD PTTW: 1.0000 | MEDICATED_PATCH | TRANSDERMAL | 1 refills | Status: DC

## 2018-12-14 MED ORDER — PROGESTERONE MICRONIZED 100 MG PO CAPS: 100.0000 mg | ORAL_CAPSULE | Freq: Every day | ORAL | 1 refills | Status: DC

## 2018-12-14 NOTE — Progress Notes (Signed)
Virtual Visit via Video Note  I connected with [place] on 12/14/18 at 11:30 AM EDT by a video enabled telemedicine application and verified that I am speaking with the correct person using two identifiers.  Location: Patient: Home Provider: Office   I discussed the limitations of evaluation and management by telemedicine and the availability of in person appointments. The patient expressed understanding and agreed to proceed.    The patient was advised to call back or seek an in-person evaluation if the symptoms worsen or if the condition fails to improve as anticipated.    Salvadore Dom, MD    GYNECOLOGY  VISIT   HPI: 53 y.o.   Married White or Caucasian Not Hispanic or Latino  female   [phone] with No LMP recorded. Patient is perimenopausal.   Wants to discuss vasomotor symptoms. She is on estroven with some help, just not enough. She is having 4-5 hot flashes or night sweats at night, she is keeping the room cool. She is having 4-5 a day. Not sleeping well, tired all the time, irritable. She has gained 12 lbs.  No libido. No pain with intercourse. Rarely able to achieve orgasm.   GYNECOLOGIC HISTORY: No LMP recorded. Patient is perimenopausal. Contraception:Vasectomy Menopausal hormone therapy: none        OB History    Gravida  6   Para  3   Term  3   Preterm      AB  2   Living  3     SAB  1   TAB      Ectopic      Multiple      Live Births                 Patient Active Problem List   Diagnosis Date Noted  . Hx of adenomatous colonic polyps 07/05/2018  . History of gestational diabetes 05/01/2017  . Migraine 08/23/2014  . Vitamin D deficiency 08/23/2014  . Perimenopause 08/23/2014    Past Medical History:  Diagnosis Date  . History of gestational diabetes   . Hx of adenomatous colonic polyps 07/05/2018  . Migraine without aura     Past Surgical History:  Procedure Laterality Date  . DILATATION & CURETTAGE/HYSTEROSCOPY  WITH MYOSURE N/A 06/02/2018   Procedure: DILATATION & CURETTAGE/HYSTEROSCOPY;  Surgeon: Salvadore Dom, MD;  Location: [facility];  Service: Gynecology;  Laterality: N/A;  With ultrasound guidance  . [place] OF UTERUS  1997/1998  . GYNECOLOGIC CRYOSURGERY    . OPERATIVE ULTRASOUND N/A 06/02/2018   Procedure: OPERATIVE ULTRASOUND;  Surgeon: Salvadore Dom, MD;  Location: [facility];  Service: Gynecology;  Laterality: N/A;    Current Outpatient Medications  Medication Sig Dispense Refill  . ibuprofen (ADVIL,MOTRIN) 200 MG tablet Take 400 mg by mouth as needed.    . Multiple Vitamin (MULTIVITAMIN) tablet Take 1 tablet by mouth daily.    Marland Kitchen VITAMIN D PO Take by mouth.     Current Facility-Administered Medications  Medication Dose Route Frequency Provider Last Rate Last Dose  . 0.9 %  sodium chloride infusion  500 mL Intravenous Once Gatha Mayer, MD         ALLERGIES: Patient has no known allergies.  Family History  Problem Relation Age of Onset  . Diabetes Mother   . Melanoma Mother   . Heart disease Father   . Seizures Father   . Colon cancer Paternal Aunt  25's  . Heart disease Maternal Grandmother   . Diabetes Maternal Grandfather   . Cancer Paternal Grandfather   . Esophageal cancer Paternal Aunt   . Stomach cancer Neg Hx   . Ulcerative colitis Neg Hx   . Colon polyps Neg Hx     Social History   Socioeconomic History  . Marital status: Married    Spouse name: Not on file  . Number of children: Not on file  . Years of education: Not on file  . Highest education level: Not on file  Occupational History  . Not on file  Social Needs  . Financial resource strain: Not on file  . Food insecurity:    Worry: Not on file    Inability: Not on file  . Transportation needs:    Medical: Not on file    Non-medical: Not on file  Tobacco Use  . Smoking status: Never Smoker  . Smokeless tobacco: Never Used   Substance and Sexual Activity  . Alcohol use: Yes    Alcohol/week: 2.0 standard drinks    Types: 2 Glasses of wine per week    Comment: OCC  . Drug use: No  . Sexual activity: Yes    Partners: Male    Birth control/protection: Surgical    Comment: Vasectomy  Lifestyle  . Physical activity:    Days per week: Not on file    Minutes per session: Not on file  . Stress: Not on file  Relationships  . Social connections:    Talks on phone: Not on file    Gets together: Not on file    Attends religious service: Not on file    Active member of club or organization: Not on file    Attends meetings of clubs or organizations: Not on file    Relationship status: Not on file  . Intimate partner violence:    Fear of current or ex partner: Not on file    Emotionally abused: Not on file    Physically abused: Not on file    Forced sexual activity: Not on file  Other Topics Concern  . Not on file  Social History Narrative  . Not on file    Review of Systems  Constitutional:       Fatigue   HENT: Negative.   Eyes: Negative.   Respiratory: Negative.   Cardiovascular: Negative.   Gastrointestinal: Negative.   Genitourinary: Negative.   Musculoskeletal: Negative.   Skin:       Hair loss   Neurological: Negative.   Endo/Heme/Allergies: Negative.   Psychiatric/Behavioral: Negative.     PHYSICAL EXAMINATION:    There were no vitals taken for this visit.      ASSESSMENT Hot flashes, night sweats, sleep disturbance, mood changes Absent libido, some difficulty achieving orgasm Hair loss, she is seeing Dermatology later this week. Normal TSH in 10/19.    PLAN Discussed options for treating her menopausal symptoms, she desires HRT. Discussed risks of HRT, no contraindications Start Vivelle dot 0.025 mg patch and prometrium  F/U in one month Check testosterone levels, we discussed possible treatment with compounded testosterone   An After Visit Summary was printed and given  to the patient.  I provided 16 minutes of non-face-to-face time during this encounter.

## 2018-12-31 ENCOUNTER — Telehealth: Payer: Self-pay | Admitting: Obstetrics and Gynecology

## 2018-12-31 NOTE — Telephone Encounter (Signed)
Patient was put on estradiol for menopause and has started her period.

## 2018-12-31 NOTE — Telephone Encounter (Signed)
Left message to call Ahaan Zobrist, RN at [facility] [phone].   

## 2018-12-31 NOTE — Telephone Encounter (Signed)
Spoke with patient. Patient started estradiol 0.025 mg patch twice wkly and progesterone 100 mg daily approximately 2 wks ago. Medication has been working well, has been able to sleep and hot flashes have improved. Patient reports episode of bleeding this morning, "was like a normal flow", bleeding has since stopped. Denies any other GYN symptoms or pain.   Advised patient I will review with Dr. Talbert Nan and return call with recommendations, patient agreeable.   Dr. Talbert Nan -please advise.

## 2018-12-31 NOTE — Telephone Encounter (Signed)
The patient had a negative hysteroscopy, D&C 6 months ago. It is not uncommon to have some light bleeding with starting HRT. It should not persist for more than 6 months. If she has any bleeding past that time she would need further evaluation. If her bleeding gets heavy, is continuous, or with any other concerns she should call. She should f/u with me in a few weeks as well so we can continue her current hrt or make adjustments if needed. Otherwise she should f/u with her annual in the fall

## 2018-12-31 NOTE — Telephone Encounter (Signed)
Spoke with patient, advised as seen below per Dr. Jertson. Patient verbalizes understanding and is agreeable. Encounter closed.  

## 2019-01-20 ENCOUNTER — Encounter: Payer: Self-pay | Admitting: Obstetrics and Gynecology

## 2019-01-20 ENCOUNTER — Ambulatory Visit (INDEPENDENT_AMBULATORY_CARE_PROVIDER_SITE_OTHER): Payer: Managed Care, Other (non HMO) | Admitting: Obstetrics and Gynecology

## 2019-01-20 DIAGNOSIS — N950 Postmenopausal bleeding: Secondary | ICD-10-CM

## 2019-01-20 DIAGNOSIS — Z79890 Hormone replacement therapy (postmenopausal): Secondary | ICD-10-CM

## 2019-01-20 MED ORDER — ESTRADIOL 0.025 MG/24HR TD PTTW: 1.0000 | MEDICATED_PATCH | TRANSDERMAL | 0 refills | Status: DC

## 2019-01-20 MED ORDER — PROGESTERONE MICRONIZED 100 MG PO CAPS: 100.0000 mg | ORAL_CAPSULE | Freq: Every day | ORAL | 0 refills | Status: DC

## 2019-01-20 NOTE — Progress Notes (Signed)
Virtual Visit via Video Note  I connected with [place] on 01/20/19 at  9:30 AM EDT by a video enabled telemedicine application and verified that I am speaking with the correct person using two identifiers.  Location: Patient: Home Provider: [facility]   I discussed the limitations of evaluation and management by telemedicine and the availability of in person appointments. The patient expressed understanding and agreed to proceed.  GYNECOLOGY  VISIT   HPI: 53 y.o.   Married White or Caucasian Not Hispanic or Latino  female   [phone] with No LMP recorded. Patient is perimenopausal.   [place] to f/u on HRT. She was started on low dose HRT last month for significant vasomotor symptoms, sleep disturbance and mood changes. She is feeling good on the HRT, doing much better. Her hot flashes and night sweats are so much better. Sleeping well, having more dreams. Feels rested during the day. Energy is so much better. She has been able to exercise better. Has lost 8 lbs. She is taking the Prometrium in the am.   She had gone ~1 year without bleeding, then had a light spotting in 2/19 and again in 9/19. She had a PMP FSH, slightly thickened endometrial stripe and stenotic os. She then underwent a  hysteroscopy, D&C in 11/19, she had atrophic appearing endometrium, some uterine scar tissue. Biopsy with atrophy.  On 12/31/18 she had what seemed to be a normal cycle, lasted for 6 days. Changing a pad in 2-3 hours. She was cramping prior to the bleeding, felt premenstrual. She is feeling a little cramping currently and feels like she may get another cycle.   Her libido is better, never got the testosterone levels that were ordered.    GYNECOLOGIC HISTORY: No LMP recorded. Patient is perimenopausal. Contraception: Vasectomy Menopausal hormone therapy: HRT        OB History    Gravida  6   Para  3   Term  3   Preterm      AB  2   Living  3     SAB  1   TAB       Ectopic      Multiple      Live Births                 Patient Active Problem List   Diagnosis Date Noted  . Hx of adenomatous colonic polyps 07/05/2018  . History of gestational diabetes 05/01/2017  . Migraine 08/23/2014  . Vitamin D deficiency 08/23/2014  . Perimenopause 08/23/2014    Past Medical History:  Diagnosis Date  . History of gestational diabetes   . Hx of adenomatous colonic polyps 07/05/2018  . Migraine without aura     Past Surgical History:  Procedure Laterality Date  . DILATATION & CURETTAGE/HYSTEROSCOPY WITH MYOSURE N/A 06/02/2018   Procedure: DILATATION & CURETTAGE/HYSTEROSCOPY;  Surgeon: Salvadore Dom, MD;  Location: [facility];  Service: Gynecology;  Laterality: N/A;  With ultrasound guidance  . [place] OF UTERUS  1997/1998  . GYNECOLOGIC CRYOSURGERY    . OPERATIVE ULTRASOUND N/A 06/02/2018   Procedure: OPERATIVE ULTRASOUND;  Surgeon: Salvadore Dom, MD;  Location: [facility];  Service: Gynecology;  Laterality: N/A;    Current Outpatient Medications  Medication Sig Dispense Refill  . estradiol (VIVELLE-DOT) 0.025 MG/24HR Place 1 patch onto the skin 2 (two) times a week. 8 patch 1  . ibuprofen (ADVIL,MOTRIN) 200 MG tablet Take 400  mg by mouth as needed.    . Multiple Vitamin (MULTIVITAMIN) tablet Take 1 tablet by mouth daily.    . progesterone (PROMETRIUM) 100 MG capsule Take 1 capsule (100 mg total) by mouth daily. 30 capsule 1  . VITAMIN D PO Take by mouth.     Current Facility-Administered Medications  Medication Dose Route Frequency Provider Last Rate Last Dose  . 0.9 %  sodium chloride infusion  500 mL Intravenous Once Gatha Mayer, MD         ALLERGIES: Patient has no known allergies.  Family History  Problem Relation Age of Onset  . Diabetes Mother   . Melanoma Mother   . Heart disease Father   . Seizures Father   . Colon cancer Paternal Aunt        60's  . Heart  disease Maternal Grandmother   . Diabetes Maternal Grandfather   . Cancer Paternal Grandfather   . Esophageal cancer Paternal Aunt   . Stomach cancer Neg Hx   . Ulcerative colitis Neg Hx   . Colon polyps Neg Hx     Social History   Socioeconomic History  . Marital status: Married    Spouse name: Not on file  . Number of children: Not on file  . Years of education: Not on file  . Highest education level: Not on file  Occupational History  . Not on file  Social Needs  . Financial resource strain: Not on file  . Food insecurity    Worry: Not on file    Inability: Not on file  . Transportation needs    Medical: Not on file    Non-medical: Not on file  Tobacco Use  . Smoking status: Never Smoker  . Smokeless tobacco: Never Used  Substance and Sexual Activity  . Alcohol use: Yes    Alcohol/week: 2.0 standard drinks    Types: 2 Glasses of wine per week    Comment: OCC  . Drug use: No  . Sexual activity: Yes    Partners: Male    Birth control/protection: Surgical    Comment: Vasectomy  Lifestyle  . Physical activity    Days per week: Not on file    Minutes per session: Not on file  . Stress: Not on file  Relationships  . Social Herbalist on phone: Not on file    Gets together: Not on file    Attends religious service: Not on file    Active member of club or organization: Not on file    Attends meetings of clubs or organizations: Not on file    Relationship status: Not on file  . Intimate partner violence    Fear of current or ex partner: Not on file    Emotionally abused: Not on file    Physically abused: Not on file    Forced sexual activity: Not on file  Other Topics Concern  . Not on file  Social History Narrative  . Not on file    ROS  PHYSICAL EXAMINATION:    There were no vitals taken for this visit.    General appearance: alert, cooperative and appears stated age   ASSESSMENT Menopausal symptoms, so much better on low dose  HRT Vaginal bleeding since starting HRT. Felt like she had a normal cycle, feels premenstrual again. She had a PMP [place] last fall, underwent a hysteroscopy, D&C for spotting. Endometrium was atrophic, no lesions Low libido has improved on HRT  PLAN Continue low dose HRT, we discussed that some women will have bleeding for up to 6 months after starting HRT, it shouldn't cause her to start cycling again She will calendar all bleeding and f/u in 3 months She will try taking her Prometrium at night.  Call with prolonged or heavy bleeding or any other concerns     I provided ~13 minutes of non-face-to-face time during this encounter.   Salvadore Dom, MD

## 2019-02-03 ENCOUNTER — Ambulatory Visit: Payer: Managed Care, Other (non HMO)

## 2019-03-22 ENCOUNTER — Ambulatory Visit
Admission: RE | Admit: 2019-03-22 | Discharge: 2019-03-22 | Disposition: A | Discharge status: Home / Self Care | Payer: Managed Care, Other (non HMO) | Source: Ambulatory Visit | Attending: Obstetrics and Gynecology | Admitting: Obstetrics and Gynecology

## 2019-03-22 ENCOUNTER — Other Ambulatory Visit: Payer: Self-pay

## 2019-03-22 DIAGNOSIS — Z1231 Encounter for screening mammogram for malignant neoplasm of breast: Secondary | ICD-10-CM

## 2019-04-19 NOTE — Progress Notes (Signed)
GYNECOLOGY  VISIT   HPI: 53 y.o.   Married White or Caucasian Not Hispanic or Latino  female   [phone] with Patient's last menstrual period was 01/21/2019.   here for 3 month recheck for HRT   She was started on low dose HRT in 5/20 for significant vasomotor symptoms, sleep disturbances and mood changes.  Her symptoms improved.  At the end of 5/20 she had what felt like a normal cycle for her, felt premenstrual prior, bleed x 6 days with moderate flow. No bleeding since then. Occasionally has a little cramp in her RLQ/pelvis, doesn't last long. She has some issues with constipation.   In 11/19 she had a hysteroscopy D&C for some AUB, returned with atrophy. FSH was 78.8.  Since then she has been doing well. Sleeping well, not gaining weight, more energy so she can work out more. No vasomotor symptoms.    She has had some issues with hair loss. She has seen a Dermatologist, hasn't had a TSH.   GYNECOLOGIC HISTORY: Patient's last menstrual period was 01/21/2019. Contraception:Vasectomy Menopausal hormone therapy: Estradol patches and Progesterone        OB History    Gravida  6   Para  3   Term  3   Preterm      AB  2   Living  3     SAB  1   TAB      Ectopic      Multiple      Live Births                 Patient Active Problem List   Diagnosis Date Noted  . Hx of adenomatous colonic polyps 07/05/2018  . History of gestational diabetes 05/01/2017  . Migraine 08/23/2014  . Vitamin D deficiency 08/23/2014  . Perimenopause 08/23/2014    Past Medical History:  Diagnosis Date  . History of gestational diabetes   . Hx of adenomatous colonic polyps 07/05/2018  . Migraine without aura     Past Surgical History:  Procedure Laterality Date  . DILATATION & CURETTAGE/HYSTEROSCOPY WITH MYOSURE N/A 06/02/2018   Procedure: DILATATION & CURETTAGE/HYSTEROSCOPY;  Surgeon: Salvadore Dom, MD;  Location: [facility];  Service: Gynecology;  Laterality:  N/A;  With ultrasound guidance  . [place] OF UTERUS  1997/1998  . GYNECOLOGIC CRYOSURGERY    . OPERATIVE ULTRASOUND N/A 06/02/2018   Procedure: OPERATIVE ULTRASOUND;  Surgeon: Salvadore Dom, MD;  Location: [facility];  Service: Gynecology;  Laterality: N/A;    Current Outpatient Medications  Medication Sig Dispense Refill  . cetirizine (ZYRTEC) 10 MG tablet Take 10 mg by mouth daily.    Marland Kitchen estradiol (VIVELLE-DOT) 0.025 MG/24HR Place 1 patch onto the skin 2 (two) times a week. 24 patch 0  . ibuprofen (ADVIL,MOTRIN) 200 MG tablet Take 400 mg by mouth as needed.    Marland Kitchen ketoconazole (NIZORAL) 2 % shampoo APP EXT 2 TO 3 TIMES A WK UTD    . Multiple Vitamin (MULTIVITAMIN) tablet Take 1 tablet by mouth daily.    . Multiple Vitamins-Minerals (HAIR/SKIN/NAILS/BIOTIN PO)     . Omega-3 Fatty Acids (FISH OIL) 1000 MG CAPS     . progesterone (PROMETRIUM) 100 MG capsule Take 1 capsule (100 mg total) by mouth daily. 90 capsule 0  . spironolactone (ALDACTONE) 50 MG tablet Take 50 mg by mouth daily.    Marland Kitchen VITAMIN D PO Take by mouth.     Current Facility-Administered  Medications  Medication Dose Route Frequency Provider Last Rate Last Dose  . 0.9 %  sodium chloride infusion  500 mL Intravenous Once Gatha Mayer, MD         ALLERGIES: Patient has no known allergies.  Family History  Problem Relation Age of Onset  . Diabetes Mother   . Melanoma Mother   . Heart disease Father   . Seizures Father   . Colon cancer Paternal Aunt        60's  . Heart disease Maternal Grandmother   . Diabetes Maternal Grandfather   . Cancer Paternal Grandfather   . Esophageal cancer Paternal Aunt   . Stomach cancer Neg Hx   . Ulcerative colitis Neg Hx   . Colon polyps Neg Hx     Social History   Socioeconomic History  . Marital status: Married    Spouse name: Not on file  . Number of children: Not on file  . Years of education: Not on file  . Highest education level: Not  on file  Occupational History  . Not on file  Social Needs  . Financial resource strain: Not on file  . Food insecurity    Worry: Not on file    Inability: Not on file  . Transportation needs    Medical: Not on file    Non-medical: Not on file  Tobacco Use  . Smoking status: Never Smoker  . Smokeless tobacco: Never Used  Substance and Sexual Activity  . Alcohol use: Yes    Alcohol/week: 2.0 standard drinks    Types: 2 Glasses of wine per week    Comment: OCC  . Drug use: No  . Sexual activity: Yes    Partners: Male    Birth control/protection: Surgical    Comment: Vasectomy  Lifestyle  . Physical activity    Days per week: Not on file    Minutes per session: Not on file  . Stress: Not on file  Relationships  . Social Herbalist on phone: Not on file    Gets together: Not on file    Attends religious service: Not on file    Active member of club or organization: Not on file    Attends meetings of clubs or organizations: Not on file    Relationship status: Not on file  . Intimate partner violence    Fear of current or ex partner: Not on file    Emotionally abused: Not on file    Physically abused: Not on file    Forced sexual activity: Not on file  Other Topics Concern  . Not on file  Social History Narrative  . Not on file    Review of Systems  Constitutional: Negative.   HENT: Negative.   Eyes: Negative.   Respiratory: Negative.   Cardiovascular: Negative.   Gastrointestinal: Negative.   Genitourinary: Negative.   Musculoskeletal: Negative.   Skin: Negative.   Neurological: Negative.   Endo/Heme/Allergies: Negative.   Psychiatric/Behavioral: Negative.     PHYSICAL EXAMINATION:    BP 112/60 (BP Location: Right Arm, Patient Position: Sitting, Cuff Size: Normal)   Pulse 88   Temp (!) 97.4 F (36.3 C) (Temporal)   Resp 12   Wt 162 lb (73.5 kg)   LMP 01/21/2019 Comment: spotting  BMI 26.96 kg/m     General appearance: alert, cooperative  and appears stated age Neck: no adenopathy, supple, symmetrical, trachea midline and thyroid normal to inspection and palpation  ASSESSMENT HRT,  feeling so much better. She had one episode of bleeding since starting HRT, no further issues. Negative hysteroscopy, D&C 6 months prior to the bleed. Hair loss, seeing a Dermatologist. Hasn't had her thyroid checked    PLAN Continue HRT F/U for annual exam next month TSH today Call with any concerns   An After Visit Summary was printed and given to the patient.

## 2019-04-20 ENCOUNTER — Other Ambulatory Visit: Payer: Self-pay

## 2019-04-20 NOTE — Telephone Encounter (Signed)
This patient was supposed to calendar all bleeding for 3 months then f/u. I see she has an annual exam next month. Please call and check on her prior to refilling her vivelle dot. She should also need a refill on her prometrium.

## 2019-04-20 NOTE — Telephone Encounter (Signed)
Medication refill request: Estradiol Last AEX:  05/07/2018 JJ Next AEX: 05/13/2019 Last MMG (if hormonal medication request): 03/22/2019 BIRADS 1 Negative Density B Refill authorized: Pending #[id number] with no refills if appropriate. Please advise.

## 2019-04-21 ENCOUNTER — Other Ambulatory Visit: Payer: Self-pay

## 2019-04-22 ENCOUNTER — Ambulatory Visit: Payer: Managed Care, Other (non HMO) | Admitting: Obstetrics and Gynecology

## 2019-04-22 ENCOUNTER — Encounter: Payer: Self-pay | Admitting: Obstetrics and Gynecology

## 2019-04-22 VITALS — BP 112/60 | HR 88 | Temp 97.4°F | Resp 12 | Wt 162.0 lb

## 2019-04-22 DIAGNOSIS — L659 Nonscarring hair loss, unspecified: Secondary | ICD-10-CM

## 2019-04-22 DIAGNOSIS — Z79890 Hormone replacement therapy (postmenopausal): Secondary | ICD-10-CM

## 2019-04-22 MED ORDER — ESTRADIOL 0.025 MG/24HR TD PTTW: 1.0000 | MEDICATED_PATCH | TRANSDERMAL | 0 refills | Status: DC

## 2019-04-22 MED ORDER — PROGESTERONE MICRONIZED 100 MG PO CAPS: 100.0000 mg | ORAL_CAPSULE | Freq: Every day | ORAL | 0 refills | Status: DC

## 2019-04-23 LAB — TSH: TSH: 1.33 u[IU]/mL (ref 0.450–4.500)

## 2019-04-23 NOTE — Telephone Encounter (Signed)
Patient was in our office for a visit 04/22/19. Closing encounter

## 2019-05-12 ENCOUNTER — Other Ambulatory Visit: Payer: Self-pay

## 2019-05-12 NOTE — Progress Notes (Signed)
53 y.o. [id number] Married White or Caucasian Not Hispanic or Latino female here for annual exam.   She started on HRT in 5/20, doing much better.   She had a hysteroscopy, D&C in 11/20, negative pathology. Had what seemed like a light cycle in May, 2020. Then started spotting 2 days ago. Slight cramping prior to the bleeding. She wasn't sexually active prior to the bleeding. No dyspareunia.     Patient's last menstrual period was 05/11/2019.          Sexually active: Yes.    The current method of family planning is vasectomy.  Exercising: Yes.    walking Smoker:  no  Health Maintenance: Pap:  05/01/2017 normal with negative HPV, 2014 normal per patient History of abnormal Pap:  Yes, years ago -- patient had Cryosurgery in the early 2000's MMG:  03/22/2019 Birads 1 negative Colonoscopy: 06/25/18 polyps, f/u in 5 years BMD: Done with Dr. Clayborne Dana at [facility], normal per patient  TDaP: 02/2018  Gardasil: N/A   reports that she has never smoked. She has never used smokeless tobacco. She reports current alcohol use of about 2.0 standard drinks of alcohol per week. She reports that she does not use drugs. Homemaker. Kids are 20, 21 and 25  Past Medical History:  Diagnosis Date  . History of gestational diabetes   . Hx of adenomatous colonic polyps 07/05/2018  . Migraine without aura     Past Surgical History:  Procedure Laterality Date  . DILATATION & CURETTAGE/HYSTEROSCOPY WITH MYOSURE N/A 06/02/2018   Procedure: DILATATION & CURETTAGE/HYSTEROSCOPY;  Surgeon: Salvadore Dom, MD;  Location: [facility];  Service: Gynecology;  Laterality: N/A;  With ultrasound guidance  . [place] OF UTERUS  1997/1998  . GYNECOLOGIC CRYOSURGERY    . OPERATIVE ULTRASOUND N/A 06/02/2018   Procedure: OPERATIVE ULTRASOUND;  Surgeon: Salvadore Dom, MD;  Location: [facility];  Service: Gynecology;  Laterality: N/A;    Current  Outpatient Medications  Medication Sig Dispense Refill  . cetirizine (ZYRTEC) 10 MG tablet Take 10 mg by mouth daily.    Marland Kitchen estradiol (VIVELLE-DOT) 0.025 MG/24HR Place 1 patch onto the skin 2 (two) times a week. 24 patch 0  . ibuprofen (ADVIL,MOTRIN) 200 MG tablet Take 400 mg by mouth as needed.    Marland Kitchen ketoconazole (NIZORAL) 2 % shampoo APP EXT 2 TO 3 TIMES A WK UTD    . Multiple Vitamin (MULTIVITAMIN) tablet Take 1 tablet by mouth daily.    . Multiple Vitamins-Minerals (HAIR/SKIN/NAILS/BIOTIN PO)     . Omega-3 Fatty Acids (FISH OIL) 1000 MG CAPS     . progesterone (PROMETRIUM) 100 MG capsule Take 1 capsule (100 mg total) by mouth daily. 90 capsule 0  . spironolactone (ALDACTONE) 50 MG tablet Take 50 mg by mouth daily.    Marland Kitchen VITAMIN D PO Take by mouth.     Current Facility-Administered Medications  Medication Dose Route Frequency Provider Last Rate Last Dose  . 0.9 %  sodium chloride infusion  500 mL Intravenous Once Gatha Mayer, MD        Family History  Problem Relation Age of Onset  . Diabetes Mother   . Melanoma Mother   . Heart disease Father   . Seizures Father   . Colon cancer Paternal Aunt        60's  . Heart disease Maternal Grandmother   . Diabetes Maternal Grandfather   . Cancer Paternal Grandfather   .  Esophageal cancer Paternal Aunt   . Stomach cancer Neg Hx   . Ulcerative colitis Neg Hx   . Colon polyps Neg Hx     Review of Systems  Constitutional: Negative.   HENT: Negative.   Eyes: Negative.   Respiratory: Negative.   Cardiovascular: Negative.   Gastrointestinal: Negative.   Endocrine: Negative.   Genitourinary: Negative.   Musculoskeletal: Negative.   Skin: Negative.   Allergic/Immunologic: Negative.   Neurological: Negative.   Hematological: Negative.   Psychiatric/Behavioral: Negative.     Exam:   BP (!) 100/58 (BP Location: Right Arm, Patient Position: Sitting, Cuff Size: Normal)   Pulse 68   Temp (!) 97.2 F (36.2 C) (Temporal)   Resp 12    Ht 5\' 5"  (1.651 m)   Wt 162 lb 12.8 oz (73.8 kg)   LMP 05/11/2019 Comment: Spotting  BMI 27.09 kg/m   Weight change: @WEIGHTCHANGE @ Height:   Height: 5\' 5"  (165.1 cm)  Ht Readings from Last 3 Encounters:  05/13/19 5\' 5"  (1.651 m)  06/25/18 5\' 5"  (1.651 m)  06/11/18 5' 5.5" (1.664 m)    General appearance: alert, cooperative and appears stated age Head: Normocephalic, without obvious abnormality, atraumatic Neck: no adenopathy, supple, symmetrical, trachea midline and thyroid normal to inspection and palpation Lungs: clear to auscultation bilaterally Cardiovascular: regular rate and rhythm Breasts: normal appearance, no masses or tenderness Abdomen: soft, non-tender; non distended,  no masses,  no organomegaly Extremities: extremities normal, atraumatic, no cyanosis or edema Skin: Skin color, texture, turgor normal. No rashes or lesions Lymph nodes: Cervical, supraclavicular, and axillary nodes normal. No abnormal inguinal nodes palpated Neurologic: Grossly normal   Pelvic: External genitalia:  no lesions              Urethra:  normal appearing urethra with no masses, tenderness or lesions              Bartholins and Skenes: normal                 Vagina: normal appearing vagina with normal color and discharge, no lesions              Cervix: no lesions               Bimanual Exam:  Uterus:  normal size, contour, position, consistency, mobility, non-tender and anteverted              Adnexa: no mass, fullness, tenderness               Rectovaginal: Confirms               Anus:  normal sphincter tone, no lesions  Chaperone was present for exam.  A:  Well Woman with normal exam  On HRT since May, feels much better  Now with second episode of spotting since May  H/O vit d def  H/O gestational diabetes  P:   Pap with hpv  Mammogram UTD  Colonoscopy UTD  Screening labs, HgbA1C, vit D  Flu shot today  Return for ultrasound, possible endometrial biopsy  Discussed breast  self exam  Discussed calcium and vit D intake

## 2019-05-13 ENCOUNTER — Other Ambulatory Visit (HOSPITAL_COMMUNITY)
Admission: RE | Admit: 2019-05-13 | Discharge: 2019-05-13 | Disposition: A | Discharge status: Home / Self Care | Payer: Managed Care, Other (non HMO) | Source: Ambulatory Visit | Attending: Obstetrics and Gynecology | Admitting: Obstetrics and Gynecology

## 2019-05-13 ENCOUNTER — Ambulatory Visit: Payer: Managed Care, Other (non HMO) | Admitting: Obstetrics and Gynecology

## 2019-05-13 ENCOUNTER — Encounter: Payer: Self-pay | Admitting: Obstetrics and Gynecology

## 2019-05-13 VITALS — BP 100/58 | HR 68 | Temp 97.2°F | Resp 12 | Ht 65.0 in | Wt 162.8 lb

## 2019-05-13 DIAGNOSIS — Z23 Encounter for immunization: Secondary | ICD-10-CM

## 2019-05-13 DIAGNOSIS — N950 Postmenopausal bleeding: Secondary | ICD-10-CM | POA: Insufficient documentation

## 2019-05-13 DIAGNOSIS — Z79890 Hormone replacement therapy (postmenopausal): Secondary | ICD-10-CM | POA: Diagnosis not present

## 2019-05-13 DIAGNOSIS — E559 Vitamin D deficiency, unspecified: Secondary | ICD-10-CM | POA: Diagnosis not present

## 2019-05-13 DIAGNOSIS — Z0000 Encounter for general adult medical examination without abnormal findings: Secondary | ICD-10-CM

## 2019-05-13 DIAGNOSIS — Z01419 Encounter for gynecological examination (general) (routine) without abnormal findings: Secondary | ICD-10-CM | POA: Diagnosis not present

## 2019-05-13 DIAGNOSIS — Z124 Encounter for screening for malignant neoplasm of cervix: Secondary | ICD-10-CM | POA: Insufficient documentation

## 2019-05-13 DIAGNOSIS — Z8632 Personal history of gestational diabetes: Secondary | ICD-10-CM

## 2019-05-13 MED ORDER — ESTRADIOL 0.025 MG/24HR TD PTTW: 1.0000 | MEDICATED_PATCH | TRANSDERMAL | 3 refills | Status: DC

## 2019-05-13 MED ORDER — PROGESTERONE MICRONIZED 100 MG PO CAPS: 100.0000 mg | ORAL_CAPSULE | Freq: Every day | ORAL | 3 refills | Status: AC

## 2019-05-13 NOTE — Patient Instructions (Signed)
EXERCISE AND DIET:  We recommended that you start or continue a regular exercise program for good health. Regular exercise means any activity that makes your heart beat faster and makes you sweat.  We recommend exercising at least 30 minutes per day at least 3 days a week, preferably 4 or 5.  We also recommend a diet low in fat and sugar.  Inactivity, poor dietary choices and obesity can cause diabetes, heart attack, stroke, and kidney damage, among others.    ALCOHOL AND SMOKING:  Women should limit their alcohol intake to no more than 7 drinks/beers/glasses of wine (combined, not each!) per week. Moderation of alcohol intake to this level decreases your risk of breast cancer and liver damage. And of course, no recreational drugs are part of a healthy lifestyle.  And absolutely no smoking or even second hand smoke. Most people know smoking can cause heart and lung diseases, but did you know it also contributes to weakening of your bones? Aging of your skin?  Yellowing of your teeth and nails?  CALCIUM AND VITAMIN D:  Adequate intake of calcium and Vitamin D are recommended.  The recommendations for exact amounts of these supplements seem to change often, but generally speaking 1,200 mg of calcium (between diet and supplement) and 800 units of Vitamin D per day seems prudent. Certain women may benefit from higher intake of Vitamin D.  If you are among these women, your doctor will have told you during your visit.    PAP SMEARS:  Pap smears, to check for cervical cancer or precancers,  have traditionally been done yearly, although recent scientific advances have shown that most women can have pap smears less often.  However, every woman still should have a physical exam from her gynecologist every year. It will include a breast check, inspection of the vulva and vagina to check for abnormal growths or skin changes, a visual exam of the cervix, and then an exam to evaluate the size and shape of the uterus and  ovaries.  And after 51 years of age, a rectal exam is indicated to check for rectal cancers. We will also provide age appropriate advice regarding health maintenance, like when you should have certain vaccines, screening for sexually transmitted diseases, bone density testing, colonoscopy, mammograms, etc.   MAMMOGRAMS:  All women over 40 years old should have a yearly mammogram. Many facilities now offer a "3D" mammogram, which may cost around $50 extra out of pocket. If possible,  we recommend you accept the option to have the 3D mammogram performed.  It both reduces the number of women who will be called back for extra views which then turn out to be normal, and it is better than the routine mammogram at detecting truly abnormal areas.    COLON CANCER SCREENING: Now recommend starting at age 45. At this time colonoscopy is not covered for routine screening until 50. There are take home tests that can be done between 45-49.   COLONOSCOPY:  Colonoscopy to screen for colon cancer is recommended for all women at age 50.  We know, you hate the idea of the prep.  We agree, BUT, having colon cancer and not knowing it is worse!!  Colon cancer so often starts as a polyp that can be seen and removed at colonscopy, which can quite literally save your life!  And if your first colonoscopy is normal and you have no family history of colon cancer, most women don't have to have it again for   10 years.  Once every ten years, you can do something that may end up saving your life, right?  We will be happy to help you get it scheduled when you are ready.  Be sure to check your insurance coverage so you understand how much it will cost.  It may be covered as a preventative service at no cost, but you should check your particular policy.      Breast Self-Awareness Breast self-awareness means being familiar with how your breasts look and feel. It involves checking your breasts regularly and reporting any changes to your  health care provider. Practicing breast self-awareness is important. A change in your breasts can be a sign of a serious medical problem. Being familiar with how your breasts look and feel allows you to find any problems early, when treatment is more likely to be successful. All women should practice breast self-awareness, including women who have had breast implants. How to do a breast self-exam One way to learn what is normal for your breasts and whether your breasts are changing is to do a breast self-exam. To do a breast self-exam: Look for Changes  1. Remove all the clothing above your waist. 2. Stand in front of a mirror in a room with good lighting. 3. Put your hands on your hips. 4. Push your hands firmly downward. 5. Compare your breasts in the mirror. Look for differences between them (asymmetry), such as: ? Differences in shape. ? Differences in size. ? Puckers, dips, and bumps in one breast and not the other. 6. Look at each breast for changes in your skin, such as: ? Redness. ? Scaly areas. 7. Look for changes in your nipples, such as: ? Discharge. ? Bleeding. ? Dimpling. ? Redness. ? A change in position. Feel for Changes Carefully feel your breasts for lumps and changes. It is best to do this while lying on your back on the floor and again while sitting or standing in the shower or tub with soapy water on your skin. Feel each breast in the following way:  Place the arm on the side of the breast you are examining above your head.  Feel your breast with the other hand.  Start in the nipple area and make  inch (2 cm) overlapping circles to feel your breast. Use the pads of your three middle fingers to do this. Apply light pressure, then medium pressure, then firm pressure. The light pressure will allow you to feel the tissue closest to the skin. The medium pressure will allow you to feel the tissue that is a little deeper. The firm pressure will allow you to feel the tissue  close to the ribs.  Continue the overlapping circles, moving downward over the breast until you feel your ribs below your breast.  Move one finger-width toward the center of the body. Continue to use the  inch (2 cm) overlapping circles to feel your breast as you move slowly up toward your collarbone.  Continue the up and down exam using all three pressures until you reach your armpit.  Write Down What You Find  Write down what is normal for each breast and any changes that you find. Keep a written record with breast changes or normal findings for each breast. By writing this information down, you do not need to depend only on memory for size, tenderness, or location. Write down where you are in your menstrual cycle, if you are still menstruating. If you are having trouble noticing differences   in your breasts, do not get discouraged. With time you will become more familiar with the variations in your breasts and more comfortable with the exam. How often should I examine my breasts? Examine your breasts every month. If you are breastfeeding, the best time to examine your breasts is after a feeding or after using a breast pump. If you menstruate, the best time to examine your breasts is 5-7 days after your period is over. During your period, your breasts are lumpier, and it may be more difficult to notice changes. When should I see my health care provider? See your health care provider if you notice:  A change in shape or size of your breasts or nipples.  A change in the skin of your breast or nipples, such as a reddened or scaly area.  Unusual discharge from your nipples.  A lump or thick area that was not there before.  Pain in your breasts.  Anything that concerns you.  

## 2019-05-14 LAB — COMPREHENSIVE METABOLIC PANEL
ALT: 16 IU/L (ref 0–32)
AST: 19 IU/L (ref 0–40)
Albumin/Globulin Ratio: 1.7 (ref 1.2–2.2)
Albumin: 4.4 g/dL (ref 3.8–4.8)
Alkaline Phosphatase: 82 IU/L (ref 39–117)
BUN/Creatinine Ratio: 18 (ref 9–23)
BUN: 12 mg/dL (ref 6–24)
Bilirubin Total: 0.6 mg/dL (ref 0.0–1.2)
CO2: 22 mmol/L (ref 20–29)
Calcium: 9.3 mg/dL (ref 8.7–10.2)
Chloride: 102 mmol/L (ref 96–106)
Creatinine, Ser: 0.65 mg/dL (ref 0.57–1.00)
GFR calc Af Amer: 120 mL/min/{1.73_m2} (ref >59)
GFR calc non Af Amer: 104 mL/min/{1.73_m2} (ref >59)
Globulin, Total: 2.6 g/dL (ref 1.5–4.5)
Glucose: 92 mg/dL (ref 65–99)
Potassium: 4.4 mmol/L (ref 3.5–5.2)
Sodium: 139 mmol/L (ref 134–144)
Total Protein: 7 g/dL (ref 6.0–8.5)

## 2019-05-14 LAB — CBC
Hematocrit: 41.2 % (ref 34.0–46.6)
Hemoglobin: 13.8 g/dL (ref 11.1–15.9)
MCH: 31.5 pg (ref 26.6–33.0)
MCHC: 33.5 g/dL (ref 31.5–35.7)
MCV: 94 fL (ref 79–97)
Platelets: 187 10*3/uL (ref 150–450)
RBC: 4.38 x10E6/uL (ref 3.77–5.28)
RDW: 11.8 % (ref 11.7–15.4)
WBC: 4.1 10*3/uL (ref 3.4–10.8)

## 2019-05-14 LAB — LIPID PANEL
Chol/HDL Ratio: 3.2 ratio (ref 0.0–4.4)
Cholesterol, Total: 193 mg/dL (ref 100–199)
HDL: 60 mg/dL (ref >39)
LDL Chol Calc (NIH): 114 mg/dL — ABNORMAL HIGH (ref 0–99)
Triglycerides: 106 mg/dL (ref 0–149)
VLDL Cholesterol Cal: 19 mg/dL (ref 5–40)

## 2019-05-14 LAB — HEMOGLOBIN A1C
Est. average glucose Bld gHb Est-mCnc: 105 mg/dL
Hgb A1c MFr Bld: 5.3 % (ref 4.8–5.6)

## 2019-05-14 LAB — VITAMIN D 25 HYDROXY (VIT D DEFICIENCY, FRACTURES): Vit D, 25-Hydroxy: 34 ng/mL (ref 30.0–100.0)

## 2019-05-14 LAB — FOLLICLE STIMULATING HORMONE: FSH: 36.5 m[IU]/mL

## 2019-05-17 ENCOUNTER — Telehealth: Payer: Self-pay | Admitting: Obstetrics and Gynecology

## 2019-05-17 NOTE — Telephone Encounter (Signed)
Patient returned call

## 2019-05-17 NOTE — Telephone Encounter (Signed)
Call placed to patient to review benefit for ultrasound. Left voicemail message requesting a return call

## 2019-05-17 NOTE — Telephone Encounter (Signed)
Returned call to patient. Reviewed benefit for recommended ultrasound. Patient acknowledges understanding of information presented. Patient is scheduled 05/25/2019 with Dr Talbert Nan. Patient is aware of the appointment date, arrival time and cancellation policy. No further questions. Will close encounter

## 2019-05-20 NOTE — Progress Notes (Signed)
GYNECOLOGY  VISIT   HPI: 53 y.o.   Married White or Caucasian Not Hispanic or Latino  female   [phone] with Patient's last menstrual period was 05/11/2019.   here for evaluation of PMP bleeding on HRT. She started on HRT in 5/20 and had a light episode of bleeding in May, then spotted again earlier this month.   Prior hysteroscopy, D&C in 11/19 with negative pathology.   GYNECOLOGIC HISTORY: Patient's last menstrual period was 05/11/2019. Contraception: Spouse with vasectomy Menopausal hormone therapy: Estradiol patch, Progesterone        OB History    Gravida  6   Para  3   Term  3   Preterm      AB  2   Living  3     SAB  1   TAB      Ectopic      Multiple      Live Births                 Patient Active Problem List   Diagnosis Date Noted  . Hx of adenomatous colonic polyps 07/05/2018  . History of gestational diabetes 05/01/2017  . Migraine 08/23/2014  . Vitamin D deficiency 08/23/2014  . Perimenopause 08/23/2014    Past Medical History:  Diagnosis Date  . History of gestational diabetes   . Hx of adenomatous colonic polyps 07/05/2018  . Migraine without aura     Past Surgical History:  Procedure Laterality Date  . DILATATION & CURETTAGE/HYSTEROSCOPY WITH MYOSURE N/A 06/02/2018   Procedure: DILATATION & CURETTAGE/HYSTEROSCOPY;  Surgeon: Salvadore Dom, MD;  Location: [facility];  Service: Gynecology;  Laterality: N/A;  With ultrasound guidance  . [place] OF UTERUS  1997/1998  . GYNECOLOGIC CRYOSURGERY    . OPERATIVE ULTRASOUND N/A 06/02/2018   Procedure: OPERATIVE ULTRASOUND;  Surgeon: Salvadore Dom, MD;  Location: [facility];  Service: Gynecology;  Laterality: N/A;    Current Outpatient Medications  Medication Sig Dispense Refill  . cetirizine (ZYRTEC) 10 MG tablet Take 10 mg by mouth daily.    Marland Kitchen estradiol (VIVELLE-DOT) 0.025 MG/24HR Place 1 patch onto the skin 2 (two) times a week.  24 patch 3  . ibuprofen (ADVIL,MOTRIN) 200 MG tablet Take 400 mg by mouth as needed.    Marland Kitchen ketoconazole (NIZORAL) 2 % shampoo APP EXT 2 TO 3 TIMES A WK UTD    . Multiple Vitamin (MULTIVITAMIN) tablet Take 1 tablet by mouth daily.    . Multiple Vitamins-Minerals (HAIR/SKIN/NAILS/BIOTIN PO)     . Omega-3 Fatty Acids (FISH OIL) 1000 MG CAPS     . progesterone (PROMETRIUM) 100 MG capsule Take 1 capsule (100 mg total) by mouth daily. 90 capsule 3  . spironolactone (ALDACTONE) 50 MG tablet Take 50 mg by mouth daily.    Marland Kitchen VITAMIN D PO Take by mouth.     Current Facility-Administered Medications  Medication Dose Route Frequency Provider Last Rate Last Dose  . 0.9 %  sodium chloride infusion  500 mL Intravenous Once Gatha Mayer, MD         ALLERGIES: Patient has no known allergies.  Family History  Problem Relation Age of Onset  . Diabetes Mother   . Melanoma Mother   . Heart disease Father   . Seizures Father   . Colon cancer Paternal Aunt        60's  . Heart disease Maternal Grandmother   . Diabetes Maternal Grandfather   .  Cancer Paternal Grandfather   . Esophageal cancer Paternal Aunt   . Stomach cancer Neg Hx   . Ulcerative colitis Neg Hx   . Colon polyps Neg Hx     Social History   Socioeconomic History  . Marital status: Married    Spouse name: Not on file  . Number of children: Not on file  . Years of education: Not on file  . Highest education level: Not on file  Occupational History  . Not on file  Social Needs  . Financial resource strain: Not on file  . Food insecurity    Worry: Not on file    Inability: Not on file  . Transportation needs    Medical: Not on file    Non-medical: Not on file  Tobacco Use  . Smoking status: Never Smoker  . Smokeless tobacco: Never Used  Substance and Sexual Activity  . Alcohol use: Yes    Alcohol/week: 2.0 standard drinks    Types: 2 Glasses of wine per week    Comment: OCC  . Drug use: No  . Sexual activity: Yes     Partners: Male    Birth control/protection: Surgical    Comment: Vasectomy  Lifestyle  . Physical activity    Days per week: Not on file    Minutes per session: Not on file  . Stress: Not on file  Relationships  . Social Herbalist on phone: Not on file    Gets together: Not on file    Attends religious service: Not on file    Active member of club or organization: Not on file    Attends meetings of clubs or organizations: Not on file    Relationship status: Not on file  . Intimate partner violence    Fear of current or ex partner: Not on file    Emotionally abused: Not on file    Physically abused: Not on file    Forced sexual activity: Not on file  Other Topics Concern  . Not on file  Social History Narrative  . Not on file    Review of Systems  Constitutional: Negative.   HENT: Negative.   Eyes: Negative.   Respiratory: Negative.   Cardiovascular: Negative.   Gastrointestinal: Negative.   Genitourinary: Negative.   Musculoskeletal: Negative.   Skin: Negative.   Neurological: Negative.   Endo/Heme/Allergies: Negative.   Psychiatric/Behavioral: Negative.     PHYSICAL EXAMINATION:    BP 102/60 (BP Location: Right Arm, Patient Position: Sitting, Cuff Size: Normal)   Temp (!) 97.1 F (36.2 C) (Skin)   Wt 162 lb (73.5 kg)   LMP 05/11/2019 Comment: Spotting  BMI 26.96 kg/m     General appearance: alert, cooperative and appears stated age  Ultrasound images reviewed with the patient  ASSESSMENT PMP spotting on HRT, ultrasound normal (other than possible adenomyosis) with very thin stripe Negative hysteroscopy D&C in 11/19    PLAN Discussed that there aren't specific guidelines for endometrial thickness in someone on HRT, but given her very thin normal appearing stripe and prior negative evaluation I think the risk of missing significant pathology is incredibly small. She will call with ongoing bleeding, would recommend endometrial biopsy.   An  After Visit Summary was printed and given to the patient.

## 2019-05-21 ENCOUNTER — Other Ambulatory Visit: Payer: Self-pay

## 2019-05-25 ENCOUNTER — Encounter: Payer: Self-pay | Admitting: Obstetrics and Gynecology

## 2019-05-25 ENCOUNTER — Ambulatory Visit (INDEPENDENT_AMBULATORY_CARE_PROVIDER_SITE_OTHER): Payer: Managed Care, Other (non HMO) | Admitting: Obstetrics and Gynecology

## 2019-05-25 ENCOUNTER — Ambulatory Visit (INDEPENDENT_AMBULATORY_CARE_PROVIDER_SITE_OTHER): Payer: Managed Care, Other (non HMO)

## 2019-05-25 ENCOUNTER — Other Ambulatory Visit: Payer: Self-pay

## 2019-05-25 VITALS — BP 102/60 | Temp 97.1°F | Wt 162.0 lb

## 2019-05-25 DIAGNOSIS — Z79890 Hormone replacement therapy (postmenopausal): Secondary | ICD-10-CM

## 2019-05-25 DIAGNOSIS — N950 Postmenopausal bleeding: Secondary | ICD-10-CM | POA: Diagnosis not present

## 2019-05-25 LAB — CYTOLOGY - PAP
Comment: NEGATIVE
Diagnosis: NEGATIVE
Diagnosis: REACTIVE
High risk HPV: NEGATIVE

## 2020-05-18 ENCOUNTER — Other Ambulatory Visit (HOSPITAL_COMMUNITY)
Admission: RE | Admit: 2020-05-18 | Discharge: 2020-05-18 | Disposition: A | Discharge status: Home / Self Care | Payer: Managed Care, Other (non HMO) | Source: Ambulatory Visit | Attending: Obstetrics and Gynecology | Admitting: Obstetrics and Gynecology

## 2020-05-18 ENCOUNTER — Encounter: Payer: Self-pay | Admitting: Obstetrics and Gynecology

## 2020-05-18 ENCOUNTER — Ambulatory Visit: Payer: Managed Care, Other (non HMO) | Admitting: Obstetrics and Gynecology

## 2020-05-18 ENCOUNTER — Other Ambulatory Visit: Payer: Self-pay

## 2020-05-18 VITALS — BP 122/60 | HR 87 | Ht 65.0 in | Wt 155.0 lb

## 2020-05-18 DIAGNOSIS — Z8632 Personal history of gestational diabetes: Secondary | ICD-10-CM | POA: Diagnosis not present

## 2020-05-18 DIAGNOSIS — Z01419 Encounter for gynecological examination (general) (routine) without abnormal findings: Secondary | ICD-10-CM

## 2020-05-18 DIAGNOSIS — Z124 Encounter for screening for malignant neoplasm of cervix: Secondary | ICD-10-CM | POA: Diagnosis present

## 2020-05-18 DIAGNOSIS — Z0000 Encounter for general adult medical examination without abnormal findings: Secondary | ICD-10-CM

## 2020-05-18 DIAGNOSIS — L659 Nonscarring hair loss, unspecified: Secondary | ICD-10-CM

## 2020-05-18 DIAGNOSIS — E559 Vitamin D deficiency, unspecified: Secondary | ICD-10-CM

## 2020-05-18 DIAGNOSIS — Z79890 Hormone replacement therapy (postmenopausal): Secondary | ICD-10-CM

## 2020-05-18 MED ORDER — ESTRADIOL 0.05 MG/24HR TD PTTW: 1.0000 | MEDICATED_PATCH | TRANSDERMAL | 3 refills | Status: DC

## 2020-05-18 MED ORDER — PROMETRIUM 100 MG PO CAPS: 100.0000 mg | ORAL_CAPSULE | Freq: Every day | ORAL | 3 refills | Status: DC

## 2020-05-18 NOTE — Patient Instructions (Addendum)
EXERCISE AND DIET:  We recommended that you start or continue a regular exercise program for good health. Regular exercise means any activity that makes your heart beat faster and makes you sweat.  We recommend exercising at least 30 minutes per day at least 3 days a week, preferably 4 or 5.  We also recommend a diet low in fat and sugar.  Inactivity, poor dietary choices and obesity can cause diabetes, heart attack, stroke, and kidney damage, among others.    ALCOHOL AND SMOKING:  Women should limit their alcohol intake to no more than 7 drinks/beers/glasses of wine (combined, not each!) per week. Moderation of alcohol intake to this level decreases your risk of breast cancer and liver damage. And of course, no recreational drugs are part of a healthy lifestyle.  And absolutely no smoking or even second hand smoke. Most people know smoking can cause heart and lung diseases, but did you know it also contributes to weakening of your bones? Aging of your skin?  Yellowing of your teeth and nails?  CALCIUM AND VITAMIN D:  Adequate intake of calcium and Vitamin D are recommended.  The recommendations for exact amounts of these supplements seem to change often, but generally speaking 1,200 mg of calcium (between diet and supplement) and 800 units of Vitamin D per day seems prudent. Certain women may benefit from higher intake of Vitamin D.  If you are among these women, your doctor will have told you during your visit.    PAP SMEARS:  Pap smears, to check for cervical cancer or precancers,  have traditionally been done yearly, although recent scientific advances have shown that most women can have pap smears less often.  However, every woman still should have a physical exam from her gynecologist every year. It will include a breast check, inspection of the vulva and vagina to check for abnormal growths or skin changes, a visual exam of the cervix, and then an exam to evaluate the size and shape of the uterus and  ovaries.  And after 52 years of age, a rectal exam is indicated to check for rectal cancers. We will also provide age appropriate advice regarding health maintenance, like when you should have certain vaccines, screening for sexually transmitted diseases, bone density testing, colonoscopy, mammograms, etc.   MAMMOGRAMS:  All women over 40 years old should have a yearly mammogram. Many facilities now offer a "3D" mammogram, which may cost around $50 extra out of pocket. If possible,  we recommend you accept the option to have the 3D mammogram performed.  It both reduces the number of women who will be called back for extra views which then turn out to be normal, and it is better than the routine mammogram at detecting truly abnormal areas.    COLON CANCER SCREENING: Now recommend starting at age 45. At this time colonoscopy is not covered for routine screening until 50. There are take home tests that can be done between 45-49.   COLONOSCOPY:  Colonoscopy to screen for colon cancer is recommended for all women at age 50.  We know, you hate the idea of the prep.  We agree, BUT, having colon cancer and not knowing it is worse!!  Colon cancer so often starts as a polyp that can be seen and removed at colonscopy, which can quite literally save your life!  And if your first colonoscopy is normal and you have no family history of colon cancer, most women don't have to have it again for   10 years.  Once every ten years, you can do something that may end up saving your life, right?  We will be happy to help you get it scheduled when you are ready.  Be sure to check your insurance coverage so you understand how much it will cost.  It may be covered as a preventative service at no cost, but you should check your particular policy.      Breast Self-Awareness Breast self-awareness means being familiar with how your breasts look and feel. It involves checking your breasts regularly and reporting any changes to your  health care provider. Practicing breast self-awareness is important. A change in your breasts can be a sign of a serious medical problem. Being familiar with how your breasts look and feel allows you to find any problems early, when treatment is more likely to be successful. All women should practice breast self-awareness, including women who have had breast implants. How to do a breast self-exam One way to learn what is normal for your breasts and whether your breasts are changing is to do a breast self-exam. To do a breast self-exam: Look for Changes  1. Remove all the clothing above your waist. 2. Stand in front of a mirror in a room with good lighting. 3. Put your hands on your hips. 4. Push your hands firmly downward. 5. Compare your breasts in the mirror. Look for differences between them (asymmetry), such as: ? Differences in shape. ? Differences in size. ? Puckers, dips, and bumps in one breast and not the other. 6. Look at each breast for changes in your skin, such as: ? Redness. ? Scaly areas. 7. Look for changes in your nipples, such as: ? Discharge. ? Bleeding. ? Dimpling. ? Redness. ? A change in position. Feel for Changes Carefully feel your breasts for lumps and changes. It is best to do this while lying on your back on the floor and again while sitting or standing in the shower or tub with soapy water on your skin. Feel each breast in the following way:  Place the arm on the side of the breast you are examining above your head.  Feel your breast with the other hand.  Start in the nipple area and make  inch (2 cm) overlapping circles to feel your breast. Use the pads of your three middle fingers to do this. Apply light pressure, then medium pressure, then firm pressure. The light pressure will allow you to feel the tissue closest to the skin. The medium pressure will allow you to feel the tissue that is a little deeper. The firm pressure will allow you to feel the tissue  close to the ribs.  Continue the overlapping circles, moving downward over the breast until you feel your ribs below your breast.  Move one finger-width toward the center of the body. Continue to use the  inch (2 cm) overlapping circles to feel your breast as you move slowly up toward your collarbone.  Continue the up and down exam using all three pressures until you reach your armpit.  Write Down What You Find  Write down what is normal for each breast and any changes that you find. Keep a written record with breast changes or normal findings for each breast. By writing this information down, you do not need to depend only on memory for size, tenderness, or location. Write down where you are in your menstrual cycle, if you are still menstruating. If you are having trouble noticing differences   in your breasts, do not get discouraged. With time you will become more familiar with the variations in your breasts and more comfortable with the exam. How often should I examine my breasts? Examine your breasts every month. If you are breastfeeding, the best time to examine your breasts is after a feeding or after using a breast pump. If you menstruate, the best time to examine your breasts is 5-7 days after your period is over. During your period, your breasts are lumpier, and it may be more difficult to notice changes. When should I see my health care provider? See your health care provider if you notice:  A change in shape or size of your breasts or nipples.  A change in the skin of your breast or nipples, such as a reddened or scaly area.  Unusual discharge from your nipples.  A lump or thick area that was not there before.  Pain in your breasts.  Anything that concerns you.  Menopause and Hormone Replacement Therapy Menopause is a normal time of life when menstrual periods stop completely and the ovaries stop producing the female hormones estrogen and progesterone. This lack of hormones  can affect your health and cause undesirable symptoms. Hormone replacement therapy (HRT) can relieve some of those symptoms. What is hormone replacement therapy? HRT is the use of artificial (synthetic) hormones to replace hormones that your body has stopped producing because you have reached menopause. What are my options for HRT?  HRT may consist of the synthetic hormones estrogen and progestin, or it may consist of only estrogen (estrogen-only therapy). You and your health care provider will decide which form of HRT is best for you. If you choose to be on HRT and you have a uterus, estrogen and progestin are usually prescribed. Estrogen-only therapy is used for women who do not have a uterus. Possible options for taking HRT include:  Pills.  Patches.  Gels.  Sprays.  Vaginal cream.  Vaginal rings.  Vaginal inserts. The amount of hormone(s) that you take and how long you take the hormone(s) varies according to your health. It is important to:  Begin HRT with the lowest possible dosage.  Stop HRT as soon as your health care provider tells you to stop.  Work with your health care provider so that you feel informed and comfortable with your decisions. What are the benefits of HRT? HRT can reduce the frequency and severity of menopausal symptoms. Benefits of HRT vary according to the kind of symptoms that you have, how severe they are, and your overall health. HRT may help to improve the following symptoms of menopause:  Hot flashes and night sweats. These are sudden feelings of heat that spread over the face and body. The skin may turn red, like a blush. Night sweats are hot flashes that happen while you are sleeping or trying to sleep.  Bone loss (osteoporosis). The body loses calcium more quickly after menopause, causing the bones to become weaker. This can increase the risk for bone breaks (fractures).  Vaginal dryness. The lining of the vagina can become thin and dry, which can  cause pain during sex or cause infection, burning, or itching.  Urinary tract infections.  Urinary incontinence. This is the inability to control when you pass urine.  Irritability.  Short-term memory problems. What are the risks of HRT? Risks of HRT vary depending on your individual health and medical history. Risks of HRT also depend on whether you receive both estrogen and progestin or you   receive estrogen only. HRT may increase the risk of:  Spotting. This is when a small amount of blood leaks from the vagina unexpectedly.  Endometrial cancer. This cancer is in the lining of the uterus (endometrium).  Breast cancer.  Increased density of breast tissue. This can make it harder to find breast cancer on a breast X-ray (mammogram).  Stroke.  Heart disease.  Blood clots.  Gallbladder disease.  Liver disease. Risks of HRT can increase if you have any of the following conditions:  Endometrial cancer.  Liver disease.  Heart disease.  Breast cancer.  History of blood clots.  History of stroke. Follow these instructions at home:  Take over-the-counter and prescription medicines only as told by your health care provider.  Get mammograms, pelvic exams, and medical checkups as often as told by your health care provider.  Have Pap tests done as often as told by your health care provider. A Pap test is sometimes called a Pap smear. It is a screening test that is used to check for signs of cancer of the cervix and vagina. A Pap test can also identify the presence of infection or precancerous changes. Pap tests may be done: ? Every 3 years, starting at age 21. ? Every 5 years, starting after age 30, in combination with testing for human papillomavirus (HPV). ? More often or less often depending on other medical conditions you have, your age, and other risk factors.  It is up to you to get the results of your Pap test. Ask your health care provider, or the department that is  doing the test, when your results will be ready.  Keep all follow-up visits as told by your health care provider. This is important. Contact a health care provider if you have:  Pain or swelling in your legs.  Shortness of breath.  Chest pain.  Lumps or changes in your breasts or armpits.  Slurred speech.  Pain, burning, or bleeding when you urinate.  Unusual vaginal bleeding.  Dizziness or headaches.  Weakness or numbness in any part of your arms or legs.  Pain in your abdomen. Summary  Menopause is a normal time of life when menstrual periods stop completely and the ovaries stop producing the female hormones estrogen and progesterone.  Hormone replacement therapy (HRT) can relieve some of the symptoms of menopause.  HRT can reduce the frequency and severity of menopausal symptoms.  Risks of HRT vary depending on your individual health and medical history. This information is not intended to replace advice given to you by your health care provider. Make sure you discuss any questions you have with your health care provider. Document Revised: 03/24/2018 Document Reviewed: 03/24/2018 Elsevier Patient Education  2020 Elsevier Inc.  

## 2020-05-18 NOTE — Progress Notes (Signed)
54 y.o. [id number] Married White or Caucasian Not Hispanic or Latino female here for annual exam.  She is having a few hot flashes and night sweats, getting a little worse. She has been on low dose HRT since 5/20. Feels she would benefit from the next dose up. No vaginal bleeding. No dyspareunia.   C/o hair loss, thinning. She is on spironolactone from Dermatology for that.     No LMP recorded. Patient is perimenopausal.          Sexually active: Yes.    The current method of family planning is post menopausal status.    Exercising: Yes.    walking and disc golf  Smoker:  no  Health Maintenance: Pap:  05/01/2017 normal with negative HPV, 2014 normal per patient History of abnormal Pap: Yes, years ago -- patient had Cryosurgery in the early 2000's MMG: 8/17 20 Density B Bi-rads 1 neg  BMD:   Done with Dr. Clayborne Dana at [facility], normal per patient  Colonoscopy:06/25/18 polyps, f/u in 5 years  TDaP: 02/2018 Gardasil: NA   reports that she has never smoked. She has never used smokeless tobacco. She reports current alcohol use of about 2.0 standard drinks of alcohol per week. She reports that she does not use drugs. Homemaker. Kids are 73, 22 and 26. One of her sons is at [employer] in med school. Other son senior at [employer]. Daughter is a junior wants to go into audiology.   Past Medical History:  Diagnosis Date  . History of gestational diabetes   . Hx of adenomatous colonic polyps 07/05/2018  . Migraine without aura     Past Surgical History:  Procedure Laterality Date  . DILATATION & CURETTAGE/HYSTEROSCOPY WITH MYOSURE N/A 06/02/2018   Procedure: DILATATION & CURETTAGE/HYSTEROSCOPY;  Surgeon: Salvadore Dom, MD;  Location: [facility];  Service: Gynecology;  Laterality: N/A;  With ultrasound guidance  . [place] OF UTERUS  1997/1998  . GYNECOLOGIC CRYOSURGERY    . OPERATIVE ULTRASOUND N/A 06/02/2018   Procedure: OPERATIVE ULTRASOUND;   Surgeon: Salvadore Dom, MD;  Location: [facility];  Service: Gynecology;  Laterality: N/A;    Current Outpatient Medications  Medication Sig Dispense Refill  . cetirizine (ZYRTEC) 10 MG tablet Take 10 mg by mouth daily.    Marland Kitchen estradiol (VIVELLE-DOT) 0.025 MG/24HR Place 1 patch onto the skin 2 (two) times a week. 24 patch 3  . ibuprofen (ADVIL,MOTRIN) 200 MG tablet Take 400 mg by mouth as needed.    Marland Kitchen ketoconazole (NIZORAL) 2 % shampoo APP EXT 2 TO 3 TIMES A WK UTD    . Multiple Vitamin (MULTIVITAMIN) tablet Take 1 tablet by mouth daily.    . Multiple Vitamins-Minerals (HAIR/SKIN/NAILS/BIOTIN PO)     . Omega-3 Fatty Acids (FISH OIL) 1000 MG CAPS     . progesterone (PROMETRIUM) 100 MG capsule Take 1 capsule (100 mg total) by mouth daily. 90 capsule 3  . spironolactone (ALDACTONE) 50 MG tablet Take 50 mg by mouth daily.    Marland Kitchen VITAMIN D PO Take by mouth.     Current Facility-Administered Medications  Medication Dose Route Frequency Provider Last Rate Last Admin  . 0.9 %  sodium chloride infusion  500 mL Intravenous Once Gatha Mayer, MD        Family History  Problem Relation Age of Onset  . Diabetes Mother   . Melanoma Mother   . Heart disease Father   . Seizures Father   .  Colon cancer Paternal Aunt        60's  . Heart disease Maternal Grandmother   . Diabetes Maternal Grandfather   . Cancer Paternal Grandfather   . Esophageal cancer Paternal Aunt   . Stomach cancer Neg Hx   . Ulcerative colitis Neg Hx   . Colon polyps Neg Hx     Review of Systems  Allergic/Immunologic: Positive for environmental allergies.  All other systems reviewed and are negative.   Exam:   There were no vitals taken for this visit.  Weight change: @WEIGHTCHANGE @ Height:      Ht Readings from Last 3 Encounters:  05/13/19 5\' 5"  (1.651 m)  06/25/18 5\' 5"  (1.651 m)  06/11/18 5' 5.5" (1.664 m)    General appearance: alert, cooperative and appears stated age Head:  Normocephalic, without obvious abnormality, atraumatic Neck: no adenopathy, supple, symmetrical, trachea midline and thyroid normal to inspection and palpation Lungs: clear to auscultation bilaterally Cardiovascular: regular rate and rhythm Breasts: normal appearance, no masses or tenderness Abdomen: soft, non-tender; non distended,  no masses,  no organomegaly Extremities: extremities normal, atraumatic, no cyanosis or edema Skin: Skin color, texture, turgor normal. No rashes or lesions Lymph nodes: Cervical, supraclavicular, and axillary nodes normal. No abnormal inguinal nodes palpated Neurologic: Grossly normal   Pelvic: External genitalia:  no lesions              Urethra:  normal appearing urethra with no masses, tenderness or lesions              Bartholins and Skenes: normal                 Vagina: normal appearing vagina with normal color and discharge, no lesions              Cervix: no lesions               Bimanual Exam:  Uterus:  normal size, contour, position, consistency, mobility, non-tender              Adnexa: no mass, fullness, tenderness               Rectovaginal: Confirms               Anus:  normal sphincter tone, no lesions  Shanon Petty chaperoned for the exam.  A:  Well Woman with normal exam  Hair loss  Vit D def  H/O gest DM  Vasomotor symptoms have worsened, will increase estrogen patch, continue prometrium. Aware of risks    P:   Pap with hpv  Mammogram overdue  Screening labs, TSH, vit D, HgbA1C  Colonoscopy UTD  Discussed breast self exam  Discussed calcium and vit D intake

## 2020-05-19 LAB — CBC
Hematocrit: 41 % (ref 34.0–46.6)
Hemoglobin: 13.8 g/dL (ref 11.1–15.9)
MCH: 32.4 pg (ref 26.6–33.0)
MCHC: 33.7 g/dL (ref 31.5–35.7)
MCV: 96 fL (ref 79–97)
Platelets: 174 10*3/uL (ref 150–450)
RBC: 4.26 x10E6/uL (ref 3.77–5.28)
RDW: 11.6 % — ABNORMAL LOW (ref 11.7–15.4)
WBC: 3.5 10*3/uL (ref 3.4–10.8)

## 2020-05-19 LAB — COMPREHENSIVE METABOLIC PANEL
ALT: 14 IU/L (ref 0–32)
AST: 19 IU/L (ref 0–40)
Albumin/Globulin Ratio: 2.4 — ABNORMAL HIGH (ref 1.2–2.2)
Albumin: 4.8 g/dL (ref 3.8–4.9)
Alkaline Phosphatase: 66 IU/L (ref 44–121)
BUN/Creatinine Ratio: 18 (ref 9–23)
BUN: 11 mg/dL (ref 6–24)
Bilirubin Total: 0.4 mg/dL (ref 0.0–1.2)
CO2: 23 mmol/L (ref 20–29)
Calcium: 9.3 mg/dL (ref 8.7–10.2)
Chloride: 101 mmol/L (ref 96–106)
Creatinine, Ser: 0.62 mg/dL (ref 0.57–1.00)
GFR calc Af Amer: 121 mL/min/{1.73_m2} (ref >59)
GFR calc non Af Amer: 105 mL/min/{1.73_m2} (ref >59)
Globulin, Total: 2 g/dL (ref 1.5–4.5)
Glucose: 89 mg/dL (ref 65–99)
Potassium: 4 mmol/L (ref 3.5–5.2)
Sodium: 137 mmol/L (ref 134–144)
Total Protein: 6.8 g/dL (ref 6.0–8.5)

## 2020-05-19 LAB — TSH: TSH: 1.08 u[IU]/mL (ref 0.450–4.500)

## 2020-05-19 LAB — LIPID PANEL
Chol/HDL Ratio: 2.9 ratio (ref 0.0–4.4)
Cholesterol, Total: 187 mg/dL (ref 100–199)
HDL: 64 mg/dL (ref >39)
LDL Chol Calc (NIH): 102 mg/dL — ABNORMAL HIGH (ref 0–99)
Triglycerides: 120 mg/dL (ref 0–149)
VLDL Cholesterol Cal: 21 mg/dL (ref 5–40)

## 2020-05-19 LAB — CYTOLOGY - PAP
Comment: NEGATIVE
Diagnosis: NEGATIVE
High risk HPV: NEGATIVE

## 2020-05-19 LAB — HEMOGLOBIN A1C
Est. average glucose Bld gHb Est-mCnc: 100 mg/dL
Hgb A1c MFr Bld: 5.1 % (ref 4.8–5.6)

## 2020-05-19 LAB — VITAMIN D 25 HYDROXY (VIT D DEFICIENCY, FRACTURES): Vit D, 25-Hydroxy: 45.3 ng/mL (ref 30.0–100.0)

## 2020-08-03 ENCOUNTER — Other Ambulatory Visit: Payer: Self-pay | Admitting: Family Medicine

## 2020-08-03 DIAGNOSIS — Z1231 Encounter for screening mammogram for malignant neoplasm of breast: Secondary | ICD-10-CM

## 2020-08-25 ENCOUNTER — Ambulatory Visit
Admission: RE | Admit: 2020-08-25 | Discharge: 2020-08-25 | Disposition: A | Discharge status: Home / Self Care | Payer: Managed Care, Other (non HMO) | Source: Ambulatory Visit | Attending: Family Medicine | Admitting: Family Medicine

## 2020-08-25 ENCOUNTER — Other Ambulatory Visit: Payer: Self-pay

## 2020-08-25 DIAGNOSIS — Z1231 Encounter for screening mammogram for malignant neoplasm of breast: Secondary | ICD-10-CM

## 2021-04-29 ENCOUNTER — Other Ambulatory Visit: Payer: Self-pay | Admitting: Obstetrics and Gynecology

## 2021-04-30 NOTE — Telephone Encounter (Signed)
Annual exam scheduled on 08/08/2021 Last mammogram 08/2020

## 2021-05-01 MED ORDER — PROMETRIUM 100 MG PO CAPS
100.0000 mg | ORAL_CAPSULE | Freq: Every day | ORAL | 1 refills | Status: DC
Start: 2021-05-01 — End: 2021-12-24

## 2021-05-01 NOTE — Telephone Encounter (Signed)
A 3 month supply of vielle dot and prometrium with one refill was sent to her pharmacy.

## 2021-05-24 ENCOUNTER — Ambulatory Visit: Payer: Managed Care, Other (non HMO) | Admitting: Obstetrics and Gynecology

## 2021-08-07 NOTE — Progress Notes (Deleted)
56 y.o. [id number] Married White or Caucasian Not Hispanic or Latino female here for annual exam.      Patient's last menstrual period was 05/11/2019.          Sexually active: {yes no:314532}  The current method of family planning is {contraception:315051}.    Exercising: {yes no:314532}  {types:19826} Smoker:  {YES [id number]  Health Maintenance: Pap: 05/18/20 WNL Hr HPV Neg, 05/01/2017 normal with negative HPV History of abnormal Pap: Yes, years ago -- patient had Cryosurgery in the early 2000's MMG:  08/25/20 density  B Bi-rads 1 neg  BMD:   Done with Dr. Clayborne Dana at [facility], normal per patient  Colonoscopy: 06/25/18  polyps f/u 5 years  TDaP:  02/2018  Gardasil: n/a   reports that she has never smoked. She has never used smokeless tobacco. She reports current alcohol use of about 2.0 standard drinks per week. She reports that she does not use drugs.  Past Medical History:  Diagnosis Date   History of gestational diabetes    Hx of adenomatous colonic polyps 07/05/2018   Migraine without aura     Past Surgical History:  Procedure Laterality Date   DILATATION & CURETTAGE/HYSTEROSCOPY WITH MYOSURE N/A 06/02/2018   Procedure: DILATATION & CURETTAGE/HYSTEROSCOPY;  Surgeon: Salvadore Dom, MD;  Location: [place];  Service: Gynecology;  Laterality: N/A;  With ultrasound guidance   DILATION AND CURETTAGE OF UTERUS  1997/1998   GYNECOLOGIC CRYOSURGERY     OPERATIVE ULTRASOUND N/A 06/02/2018   Procedure: OPERATIVE ULTRASOUND;  Surgeon: Salvadore Dom, MD;  Location: [facility];  Service: Gynecology;  Laterality: N/A;    Current Outpatient Medications  Medication Sig Dispense Refill   estradiol (VIVELLE-DOT) 0.05 MG/24HR patch PLACE 1 PATCH (0.05 MG TOTAL) ONTO THE SKIN 2 (TWO) TIMES A WEEK. 24 patch 1   fexofenadine (ALLEGRA) 180 MG tablet Take 180 mg by mouth daily.     ibuprofen (ADVIL,MOTRIN) 200 MG tablet Take 400 mg by  mouth as needed.     ketoconazole (NIZORAL) 2 % shampoo APP EXT 2 TO 3 TIMES A WK UTD     Multiple Vitamin (MULTIVITAMIN) tablet Take 1 tablet by mouth daily.     Multiple Vitamins-Minerals (HAIR/SKIN/NAILS/BIOTIN PO)      Omega-3 Fatty Acids (FISH OIL) 1000 MG CAPS      progesterone (PROMETRIUM) 100 MG capsule Take 1 capsule (100 mg total) by mouth daily. 90 capsule 3   PROMETRIUM 100 MG capsule Take 1 capsule (100 mg total) by mouth daily. 90 capsule 1   spironolactone (ALDACTONE) 50 MG tablet Take 50 mg by mouth daily.     VITAMIN D PO Take by mouth.     Current Facility-Administered Medications  Medication Dose Route Frequency Provider Last Rate Last Admin   0.9 %  sodium chloride infusion  500 mL Intravenous Once Gatha Mayer, MD        Family History  Problem Relation Age of Onset   Diabetes Mother    Melanoma Mother    Heart disease Father    Seizures Father    Colon cancer Paternal Aunt        60's   Heart disease Maternal Grandmother    Diabetes Maternal Grandfather    Cancer Paternal Grandfather    Esophageal cancer Paternal Aunt    Stomach cancer Neg Hx    Ulcerative colitis Neg Hx    Colon polyps Neg Hx     Review of  Systems  Exam:   LMP 05/11/2019 Comment: Spotting  Weight change: @WEIGHTCHANGE @ Height:      Ht Readings from Last 3 Encounters:  05/18/20 5\' 5"  (1.651 m)  05/13/19 5\' 5"  (1.651 m)  06/25/18 5\' 5"  (1.651 m)    General appearance: alert, cooperative and appears stated age Head: Normocephalic, without obvious abnormality, atraumatic Neck: no adenopathy, supple, symmetrical, trachea midline and thyroid {CHL AMB PHY EX THYROID NORM DEFAULT:[phone]::"normal to inspection and palpation"} Lungs: clear to auscultation bilaterally Cardiovascular: regular rate and rhythm Breasts: {Exam; breast:13139::"normal appearance, no masses or tenderness"} Abdomen: soft, non-tender; non distended,  no masses,  no organomegaly Extremities: extremities  normal, atraumatic, no cyanosis or edema Skin: Skin color, texture, turgor normal. No rashes or lesions Lymph nodes: Cervical, supraclavicular, and axillary nodes normal. No abnormal inguinal nodes palpated Neurologic: Grossly normal   Pelvic: External genitalia:  no lesions              Urethra:  normal appearing urethra with no masses, tenderness or lesions              Bartholins and Skenes: normal                 Vagina: normal appearing vagina with normal color and discharge, no lesions              Cervix: {CHL AMB PHY EX CERVIX NORM DEFAULT:[phone]::"no lesions"}               Bimanual Exam:  Uterus:  {CHL AMB PHY EX UTERUS NORM DEFAULT:[phone]::"normal size, contour, position, consistency, mobility, non-tender"}              Adnexa: {CHL AMB PHY EX ADNEXA NO MASS DEFAULT:[phone]::"no mass, fullness, tenderness"}               Rectovaginal: Confirms               Anus:  normal sphincter tone, no lesions  *** chaperoned for the exam.  A:  Well Woman with normal exam  P:

## 2021-08-08 ENCOUNTER — Ambulatory Visit: Payer: Managed Care, Other (non HMO) | Admitting: Obstetrics and Gynecology

## 2021-10-11 ENCOUNTER — Other Ambulatory Visit: Payer: Self-pay | Admitting: Obstetrics and Gynecology

## 2021-10-11 NOTE — Telephone Encounter (Signed)
Last AEX 05/18/20. ?Was scheduled 05/24/21 & 08/08/21--cancelled ?Now scheduled for 01/01/22. ?Last mammo- 08/25/20.  ?

## 2021-10-27 DIAGNOSIS — J309 Allergic rhinitis, unspecified: Secondary | ICD-10-CM | POA: Insufficient documentation

## 2021-10-30 DIAGNOSIS — R21 Rash and other nonspecific skin eruption: Secondary | ICD-10-CM | POA: Insufficient documentation

## 2021-10-30 DIAGNOSIS — J301 Allergic rhinitis due to pollen: Secondary | ICD-10-CM | POA: Insufficient documentation

## 2021-10-30 DIAGNOSIS — J3081 Allergic rhinitis due to animal (cat) (dog) hair and dander: Secondary | ICD-10-CM | POA: Insufficient documentation

## 2021-11-08 DIAGNOSIS — S8991XA Unspecified injury of right lower leg, initial encounter: Secondary | ICD-10-CM | POA: Insufficient documentation

## 2021-11-20 HISTORY — PX: KNEE CARTILAGE SURGERY: SHX688

## 2021-12-23 ENCOUNTER — Other Ambulatory Visit: Payer: Self-pay | Admitting: Obstetrics and Gynecology

## 2021-12-24 NOTE — Progress Notes (Signed)
56 y.o. [id number] Married White or Caucasian Not Hispanic or Latino female here for annual exam.  She is on HRT, makes her vasomotor symptoms tolerable. No vaginal bleeding. No dyspareunia.    Patient's last menstrual period was 05/11/2019.          Sexually active: Yes.    The current method of family planning is post menopausal status.    Exercising: Yes.     Walking and now PT with her knee surgery.  Smoker:  no  Health Maintenance: Pap:  05/18/20 WNL HR Hpv Neg, 05/13/2019 WNL HR HPV Neg  History of abnormal Pap:  Yes, years ago -- patient had Cryosurgery in the early 2000's MMG:  08/25/20 Density B BI-rads 1 Neg, scheduled.  BMD:   Done with Dr. Clayborne Dana at [facility], normal per patient  Colonoscopy:06/25/18 polyps, f/u in 5 years  TDaP: 02/2018 Gardasil: NA   reports that she has never smoked. She has never used smokeless tobacco. She reports current alcohol use of about 2.0 standard drinks per week. She reports that she does not use drugs. Homemaker, 3 grown kids. Her daughter is being evaluated for possible MS, she has some brain MRI changes. She just graduated from college and has moved home.   Past Medical History:  Diagnosis Date   History of gestational diabetes    Hx of adenomatous colonic polyps 07/05/2018   Migraine without aura     Past Surgical History:  Procedure Laterality Date   DILATATION & CURETTAGE/HYSTEROSCOPY WITH MYOSURE N/A 06/02/2018   Procedure: DILATATION & CURETTAGE/HYSTEROSCOPY;  Surgeon: Salvadore Dom, MD;  Location: [facility];  Service: Gynecology;  Laterality: N/A;  With ultrasound guidance   DILATION AND CURETTAGE OF UTERUS  1997/1998   GYNECOLOGIC CRYOSURGERY     KNEE CARTILAGE SURGERY Right 11/20/2021   OPERATIVE ULTRASOUND N/A 06/02/2018   Procedure: OPERATIVE ULTRASOUND;  Surgeon: Salvadore Dom, MD;  Location: [facility];  Service: Gynecology;  Laterality: N/A;    Current Outpatient  Medications  Medication Sig Dispense Refill   ASPIRIN LOW DOSE 81 MG tablet Take 81 mg by mouth daily.     diclofenac (VOLTAREN) 75 MG EC tablet Take 75 mg by mouth 2 (two) times daily.     estradiol (VIVELLE-DOT) 0.05 MG/24HR patch PLACE 1 PATCH (0.05 MG TOTAL) ONTO THE SKIN 2 (TWO) TIMES A WEEK. 24 patch 0   fexofenadine (ALLEGRA) 180 MG tablet Take 180 mg by mouth daily.     ibuprofen (ADVIL,MOTRIN) 200 MG tablet Take 400 mg by mouth as needed.     ketoconazole (NIZORAL) 2 % shampoo APP EXT 2 TO 3 TIMES A WK UTD     Multiple Vitamin (MULTIVITAMIN) tablet Take 1 tablet by mouth daily.     Multiple Vitamins-Minerals (HAIR/SKIN/NAILS/BIOTIN PO)      progesterone (PROMETRIUM) 100 MG capsule Take 1 capsule (100 mg total) by mouth daily. 90 capsule 3   Omega-3 Fatty Acids (FISH OIL) 1000 MG CAPS      Current Facility-Administered Medications  Medication Dose Route Frequency Provider Last Rate Last Admin   0.9 %  sodium chloride infusion  500 mL Intravenous Once Gatha Mayer, MD        Family History  Problem Relation Age of Onset   Diabetes Mother    Melanoma Mother    Heart disease Father    Seizures Father    Colon cancer Paternal Aunt        40's  Heart disease Maternal Grandmother    Diabetes Maternal Grandfather    Cancer Paternal Grandfather    Esophageal cancer Paternal Aunt    Stomach cancer Neg Hx    Ulcerative colitis Neg Hx    Colon polyps Neg Hx     Review of Systems  All other systems reviewed and are negative.  Exam:   BP 120/74   Pulse 63   Ht 5' 5.5" (1.664 m)   Wt 174 lb (78.9 kg)   LMP 05/11/2019 Comment: Spotting  SpO2 99%   BMI 28.51 kg/m   Weight change: '@WEIGHTCHANGE'$ @ Height:   Height: 5' 5.5" (166.4 cm)  Ht Readings from Last 3 Encounters:  01/01/22 5' 5.5" (1.664 m)  05/18/20 '5\' 5"'$  (1.651 m)  05/13/19 '5\' 5"'$  (1.651 m)    General appearance: alert, cooperative and appears stated age Head: Normocephalic, without obvious abnormality,  atraumatic Neck: no adenopathy, supple, symmetrical, trachea midline and thyroid normal to inspection and palpation Lungs: clear to auscultation bilaterally Cardiovascular: regular rate and rhythm Breasts: normal appearance, no masses or tenderness Abdomen: soft, non-tender; non distended,  no masses,  no organomegaly Extremities: extremities normal, atraumatic, no cyanosis or edema Skin: Skin color, texture, turgor normal. No rashes or lesions Lymph nodes: Cervical, supraclavicular, and axillary nodes normal. No abnormal inguinal nodes palpated Neurologic: Grossly normal   Pelvic: External genitalia:  no lesions              Urethra:  normal appearing urethra with no masses, tenderness or lesions              Bartholins and Skenes: normal                 Vagina: normal appearing vagina with normal color and discharge, no lesions              Cervix: no lesions               Bimanual Exam:  Uterus:   no masses or tenderness              Adnexa: no mass, fullness, tenderness               Rectovaginal: Confirms               Anus:  normal sphincter tone, no lesions  Gae Dry chaperoned for the exam.  1. Well woman exam Discussed breast self exam Discussed calcium and vit D intake Mammogram scheduled Colonoscopy UTD Pap next year  2. History of gestational diabetes - Hemoglobin A1c  3. Hormone replacement therapy (HRT) Doing well on HRT - estradiol (VIVELLE-DOT) 0.05 MG/24HR patch; Place 1 patch (0.05 mg total) onto the skin 2 (two) times a week.  Dispense: 24 patch; Refill: 3 - progesterone (PROMETRIUM) 100 MG capsule; Take 1 capsule (100 mg total) by mouth daily.  Dispense: 90 capsule; Refill: 3  4. Vitamin D deficiency Not currently on vit d, just takes a multivit.  - VITAMIN D 25 Hydroxy (Vit-D Deficiency, Fractures)

## 2021-12-24 NOTE — Telephone Encounter (Signed)
Last gyn exam 10/21 Scheduled on 01/01/22 Last mammogram 08/2020  Vivelle patch 0.05 mg was approved on 10/11/21 for # month supply

## 2021-12-31 ENCOUNTER — Other Ambulatory Visit: Payer: Self-pay | Admitting: Obstetrics and Gynecology

## 2022-01-01 ENCOUNTER — Other Ambulatory Visit: Payer: Self-pay | Admitting: Obstetrics and Gynecology

## 2022-01-01 ENCOUNTER — Encounter: Payer: Self-pay | Admitting: Obstetrics and Gynecology

## 2022-01-01 ENCOUNTER — Ambulatory Visit (INDEPENDENT_AMBULATORY_CARE_PROVIDER_SITE_OTHER): Payer: Managed Care, Other (non HMO) | Admitting: Obstetrics and Gynecology

## 2022-01-01 VITALS — BP 120/74 | HR 63 | Ht 65.5 in | Wt 174.0 lb

## 2022-01-01 DIAGNOSIS — E559 Vitamin D deficiency, unspecified: Secondary | ICD-10-CM

## 2022-01-01 DIAGNOSIS — Z1231 Encounter for screening mammogram for malignant neoplasm of breast: Secondary | ICD-10-CM

## 2022-01-01 DIAGNOSIS — Z79890 Hormone replacement therapy (postmenopausal): Secondary | ICD-10-CM | POA: Diagnosis not present

## 2022-01-01 DIAGNOSIS — Z8632 Personal history of gestational diabetes: Secondary | ICD-10-CM

## 2022-01-01 DIAGNOSIS — Z01419 Encounter for gynecological examination (general) (routine) without abnormal findings: Secondary | ICD-10-CM

## 2022-01-01 MED ORDER — ESTRADIOL 0.05 MG/24HR TD PTTW: 1.0000 | MEDICATED_PATCH | TRANSDERMAL | 3 refills | Status: DC

## 2022-01-01 MED ORDER — PROGESTERONE MICRONIZED 100 MG PO CAPS: 100.0000 mg | ORAL_CAPSULE | Freq: Every day | ORAL | 3 refills | Status: DC

## 2022-01-01 NOTE — Patient Instructions (Signed)

## 2022-01-02 LAB — HEMOGLOBIN A1C
Hgb A1c MFr Bld: 5 % of total Hgb (ref <5.7)
Mean Plasma Glucose: 97 mg/dL
eAG (mmol/L): 5.4 mmol/L

## 2022-01-02 LAB — VITAMIN D 25 HYDROXY (VIT D DEFICIENCY, FRACTURES): Vit D, 25-Hydroxy: 24 ng/mL — ABNORMAL LOW (ref 30–100)

## 2022-01-09 ENCOUNTER — Ambulatory Visit
Admission: RE | Admit: 2022-01-09 | Discharge: 2022-01-09 | Disposition: A | Discharge status: Home / Self Care | Payer: Managed Care, Other (non HMO) | Source: Ambulatory Visit | Attending: Obstetrics and Gynecology | Admitting: Obstetrics and Gynecology

## 2022-01-09 DIAGNOSIS — Z1231 Encounter for screening mammogram for malignant neoplasm of breast: Secondary | ICD-10-CM

## 2022-01-11 ENCOUNTER — Other Ambulatory Visit: Payer: Self-pay | Admitting: Obstetrics and Gynecology

## 2022-01-11 DIAGNOSIS — R928 Other abnormal and inconclusive findings on diagnostic imaging of breast: Secondary | ICD-10-CM

## 2022-01-15 ENCOUNTER — Other Ambulatory Visit: Payer: Self-pay | Admitting: Obstetrics and Gynecology

## 2022-01-15 DIAGNOSIS — Z79890 Hormone replacement therapy (postmenopausal): Secondary | ICD-10-CM

## 2022-01-18 ENCOUNTER — Ambulatory Visit
Admission: RE | Admit: 2022-01-18 | Discharge: 2022-01-18 | Disposition: A | Discharge status: Home / Self Care | Payer: Managed Care, Other (non HMO) | Source: Ambulatory Visit | Attending: Obstetrics and Gynecology | Admitting: Obstetrics and Gynecology

## 2022-01-18 ENCOUNTER — Other Ambulatory Visit: Payer: Self-pay | Admitting: Obstetrics and Gynecology

## 2022-01-18 DIAGNOSIS — R928 Other abnormal and inconclusive findings on diagnostic imaging of breast: Secondary | ICD-10-CM

## 2022-01-18 DIAGNOSIS — N6320 Unspecified lump in the left breast, unspecified quadrant: Secondary | ICD-10-CM

## 2022-07-22 ENCOUNTER — Ambulatory Visit
Admission: RE | Admit: 2022-07-22 | Discharge: 2022-07-22 | Disposition: A | Discharge status: Home / Self Care | Payer: Managed Care, Other (non HMO) | Source: Ambulatory Visit | Attending: Obstetrics and Gynecology | Admitting: Obstetrics and Gynecology

## 2022-07-22 ENCOUNTER — Other Ambulatory Visit: Payer: Self-pay | Admitting: Obstetrics and Gynecology

## 2022-07-22 DIAGNOSIS — N6320 Unspecified lump in the left breast, unspecified quadrant: Secondary | ICD-10-CM

## 2022-08-11 IMAGING — MG MM DIGITAL DIAGNOSTIC UNILAT*L* W/ TOMO W/ CAD
6 series · 6 of 18 positions shown · non-contrast
Comparison: Previous exam(s).

CLINICAL DATA: Patient returns after screening study for evaluation
possible LEFT breast mass and possible LEFT breast asymmetry.

EXAM:
DIGITAL DIAGNOSTIC UNILATERAL LEFT MAMMOGRAM WITH TOMOSYNTHESIS AND
CAD; ULTRASOUND LEFT BREAST LIMITED
TECHNIQUE: Left digital diagnostic mammography and breast tomosynthesis was
performed. The images were evaluated with computer-aided detection.;
Targeted ultrasound examination of the left breast was performed.

[L MLO synth-2D]
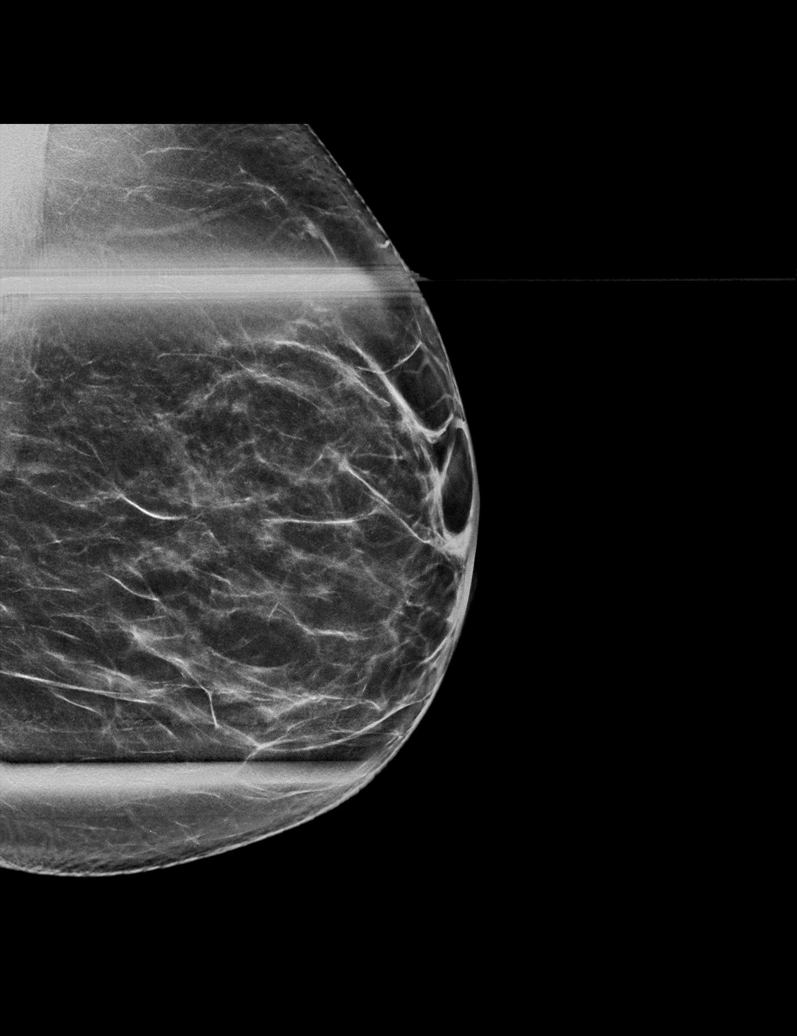

[L ML synth-2D]
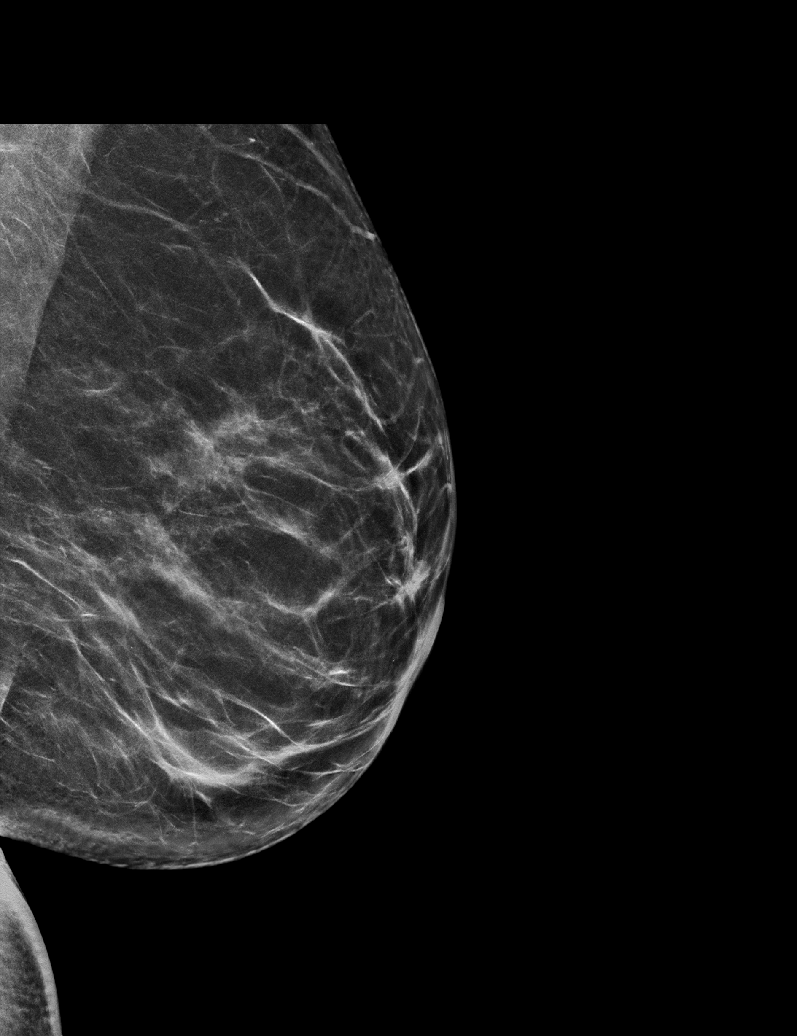

[L CC synth-2D]
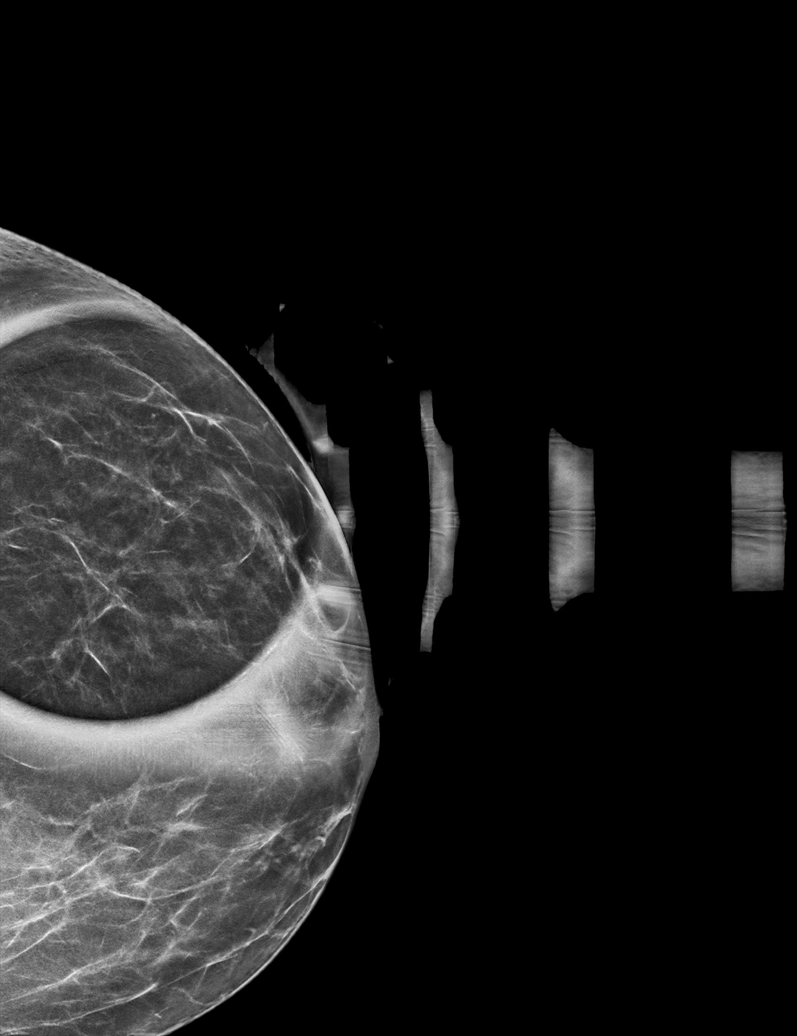

[L ML tomo · tomo slice 35/70.0]
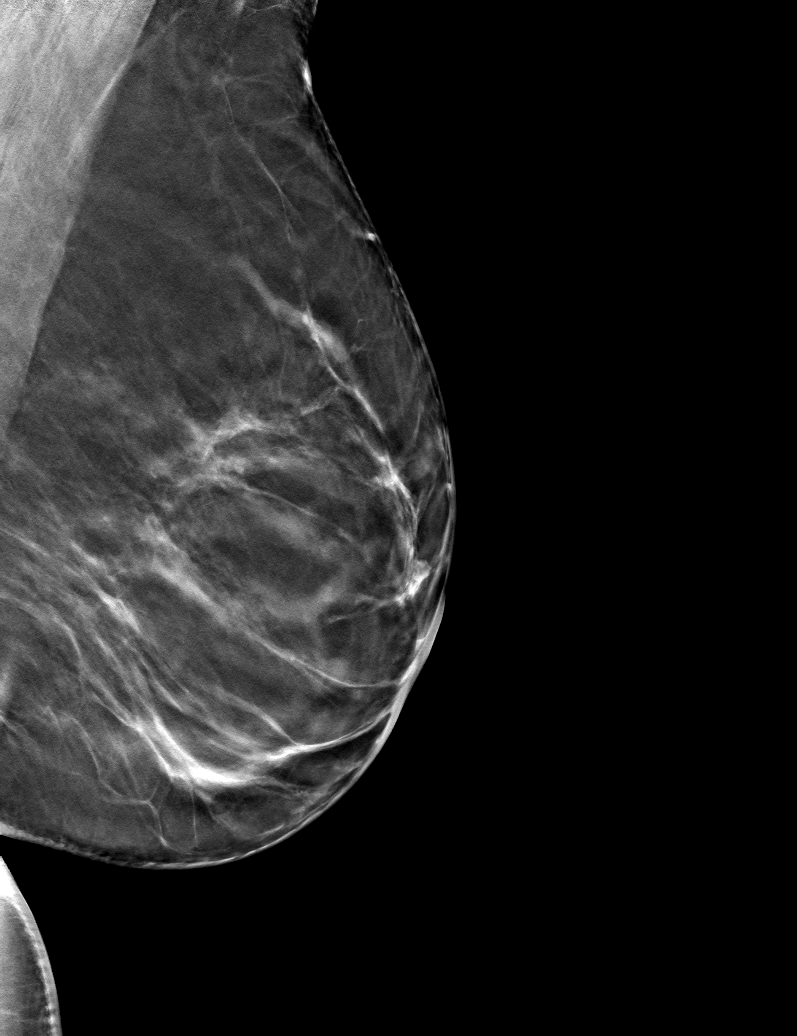

[L MLO tomo · tomo slice 32/63.0]
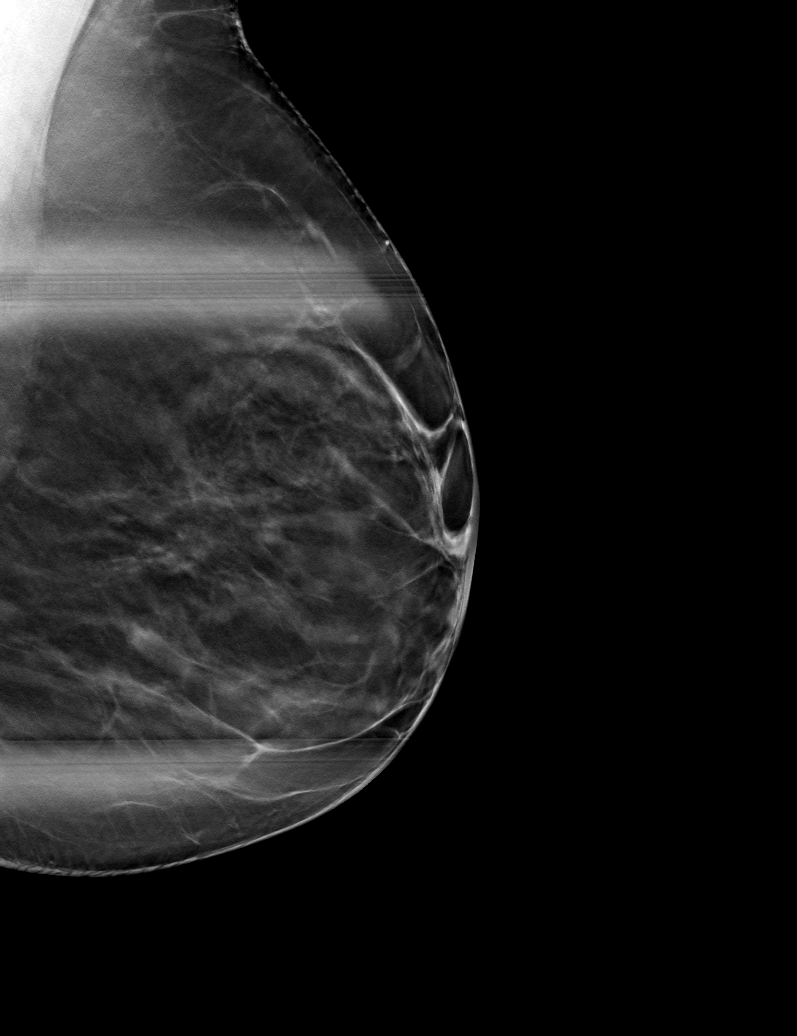

[L CC tomo · tomo slice 27/53.0]
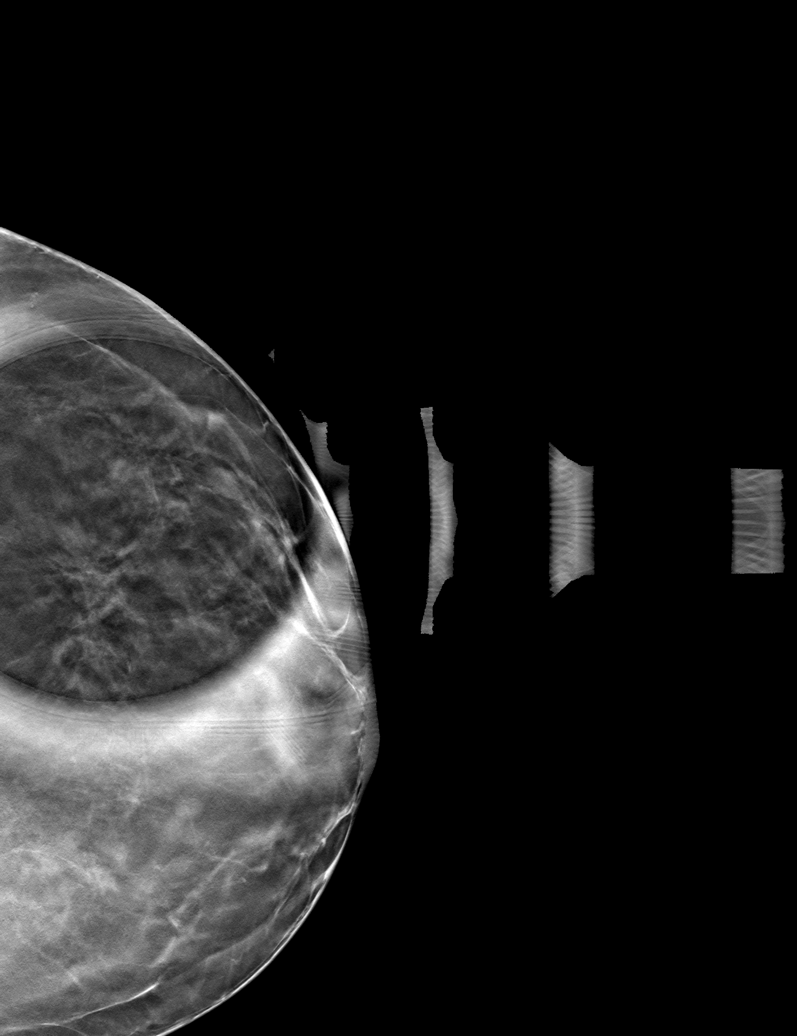

[6 of 18 positions shown; findings below may reference images not displayed]

ACR Breast Density Category b: There are scattered areas of
fibroglandular density.
FINDINGS: Additional 2-D and 3-D images are performed. These views confirm
presence of a low-attenuation circumscribed mass in the LATERAL
portion of the LEFT breast. Asymmetry does not persist.

Targeted ultrasound is performed, showing a circumscribed oval
hypoechoic parallel mass in the 3 o'clock location of the LEFT
breast 4 centimeters from the nipple, measuring 0.7 x 0.8 x
centimeters. No internal blood flow or acoustic shadowing.
IMPRESSION: Probably benign mass in the LEFT breast 3 o'clock location. We
discussed management options including excision, biopsy, and close
follow-up. Imaging followup is recommended at 6, 12, and 24 months
to assess stability. The patient concurs with this plan.

RECOMMENDATION:
Recommend LEFT breast ultrasound in 6 months.

I have discussed the findings and recommendations with the patient.
If applicable, a reminder letter will be sent to the patient
regarding the next appointment.

BI-RADS CATEGORY  3: Probably benign.

## 2022-11-11 ENCOUNTER — Other Ambulatory Visit: Payer: Self-pay | Admitting: Obstetrics and Gynecology

## 2022-11-11 DIAGNOSIS — Z79890 Hormone replacement therapy (postmenopausal): Secondary | ICD-10-CM

## 2022-11-11 NOTE — Telephone Encounter (Signed)
Medication refill request: progesterone  Last AEX:  01/01/22  Next AEX: 01/06/23 Last MMG (if hormonal medication request): 01/18/22  Refill authorized: #[id number] with 0 rf pended for today

## 2022-12-07 ENCOUNTER — Other Ambulatory Visit: Payer: Self-pay | Admitting: Obstetrics and Gynecology

## 2022-12-07 DIAGNOSIS — Z79890 Hormone replacement therapy (postmenopausal): Secondary | ICD-10-CM

## 2022-12-09 NOTE — Telephone Encounter (Signed)
Med refill request:estradiol 0.05 mg twice weekly Last AEX: 01/01/22 -JJ Next AEX: 01/22/23 -TW Last MMG (if hormonal med):  01/09/22 Screening; 01/19/23 Bilat Dx MMG; BiRads 3, rpt L BR [place] 6 mo; 07/22/22, L BR [place], Birads 3, rpt L BR [place] 6 mo  Scheduled 12/25/22, Bilat Dx MMG and [place] L BR  Refill authorized: Please Advise?  Routing to Lane, NP

## 2022-12-25 ENCOUNTER — Ambulatory Visit
Admission: RE | Admit: 2022-12-25 | Discharge: 2022-12-25 | Disposition: A | Discharge status: Home / Self Care | Payer: Managed Care, Other (non HMO) | Source: Ambulatory Visit | Attending: Obstetrics and Gynecology

## 2022-12-25 ENCOUNTER — Ambulatory Visit
Admission: RE | Admit: 2022-12-25 | Discharge: 2022-12-25 | Disposition: A | Discharge status: Home / Self Care | Payer: Managed Care, Other (non HMO) | Source: Ambulatory Visit | Attending: Obstetrics and Gynecology | Admitting: Obstetrics and Gynecology

## 2022-12-25 DIAGNOSIS — N6320 Unspecified lump in the left breast, unspecified quadrant: Secondary | ICD-10-CM

## 2022-12-31 ENCOUNTER — Other Ambulatory Visit: Payer: Self-pay | Admitting: Nurse Practitioner

## 2022-12-31 DIAGNOSIS — Z79890 Hormone replacement therapy (postmenopausal): Secondary | ICD-10-CM

## 2023-01-01 NOTE — Telephone Encounter (Signed)
Med refill request: Estradiol 0.05mg  patch, requests 90 day supply Last AEX: 01/01/22 Next AEX: 01/22/23 Last MMG (if hormonal med) 12/25/22 Refill authorized: Please Advise?

## 2023-01-06 ENCOUNTER — Ambulatory Visit: Payer: Managed Care, Other (non HMO) | Admitting: Obstetrics and Gynecology

## 2023-01-22 ENCOUNTER — Ambulatory Visit: Payer: Managed Care, Other (non HMO) | Admitting: Nurse Practitioner

## 2023-02-05 ENCOUNTER — Other Ambulatory Visit: Payer: Self-pay | Admitting: Obstetrics and Gynecology

## 2023-02-05 DIAGNOSIS — Z79890 Hormone replacement therapy (postmenopausal): Secondary | ICD-10-CM

## 2023-02-05 NOTE — Telephone Encounter (Signed)
Medication refill request: prometrium 100mg  Last AEX:  01-01-22 Next AEX: 03-27-23 Last MMG (if hormonal medication request): 12-25-22 mmg & left breast u/s birads 3: probably benign Refill authorized: please approve if appropriate

## 2023-02-23 ENCOUNTER — Other Ambulatory Visit: Payer: Self-pay | Admitting: Nurse Practitioner

## 2023-02-23 DIAGNOSIS — Z79890 Hormone replacement therapy (postmenopausal): Secondary | ICD-10-CM

## 2023-03-23 ENCOUNTER — Other Ambulatory Visit: Payer: Self-pay | Admitting: Nurse Practitioner

## 2023-03-23 DIAGNOSIS — Z79890 Hormone replacement therapy (postmenopausal): Secondary | ICD-10-CM

## 2023-03-24 NOTE — Telephone Encounter (Signed)
Medication refill request: estradiol patch  Last AEX:  01/01/22 Next AEX: 03/27/23 Last MMG (if hormonal medication request): 12/25/22 bi-rads 3 probably benign  Refill authorized: #[id number] with no rf pended for today

## 2023-03-27 ENCOUNTER — Encounter: Payer: Self-pay | Admitting: Nurse Practitioner

## 2023-03-27 ENCOUNTER — Ambulatory Visit (INDEPENDENT_AMBULATORY_CARE_PROVIDER_SITE_OTHER): Payer: Managed Care, Other (non HMO) | Admitting: Nurse Practitioner

## 2023-03-27 VITALS — BP 124/68 | HR 68 | Ht 64.5 in | Wt 167.0 lb

## 2023-03-27 DIAGNOSIS — Z01419 Encounter for gynecological examination (general) (routine) without abnormal findings: Secondary | ICD-10-CM | POA: Diagnosis not present

## 2023-03-27 DIAGNOSIS — N907 Vulvar cyst: Secondary | ICD-10-CM | POA: Diagnosis not present

## 2023-03-27 DIAGNOSIS — B3731 Acute candidiasis of vulva and vagina: Secondary | ICD-10-CM | POA: Diagnosis not present

## 2023-03-27 DIAGNOSIS — R300 Dysuria: Secondary | ICD-10-CM | POA: Diagnosis not present

## 2023-03-27 DIAGNOSIS — Z79890 Hormone replacement therapy (postmenopausal): Secondary | ICD-10-CM | POA: Diagnosis not present

## 2023-03-27 DIAGNOSIS — E559 Vitamin D deficiency, unspecified: Secondary | ICD-10-CM

## 2023-03-27 DIAGNOSIS — Z8632 Personal history of gestational diabetes: Secondary | ICD-10-CM

## 2023-03-27 LAB — URINALYSIS, COMPLETE W/RFL CULTURE
Bacteria, UA: NONE SEEN /HPF
Bilirubin Urine: NEGATIVE
Glucose, UA: NEGATIVE
Hgb urine dipstick: NEGATIVE
Hyaline Cast: NONE SEEN /LPF
Ketones, ur: NEGATIVE
Leukocyte Esterase: NEGATIVE
Nitrites, Initial: NEGATIVE
Protein, ur: NEGATIVE
RBC / HPF: NONE SEEN /HPF (ref 0–2)
Specific Gravity, Urine: 1.015 (ref 1.001–1.035)
WBC, UA: NONE SEEN /HPF (ref 0–5)
pH: 5.5 (ref 5.0–8.0)

## 2023-03-27 LAB — WET PREP FOR TRICH, YEAST, CLUE

## 2023-03-27 LAB — NO CULTURE INDICATED

## 2023-03-27 MED ORDER — PROGESTERONE MICRONIZED 100 MG PO CAPS: 100.0000 mg | ORAL_CAPSULE | Freq: Every day | ORAL | 3 refills | Status: DC

## 2023-03-27 MED ORDER — ESTRADIOL 0.05 MG/24HR TD PTTW: 1.0000 | MEDICATED_PATCH | TRANSDERMAL | 3 refills | Status: DC

## 2023-03-27 MED ORDER — FLUCONAZOLE 150 MG PO TABS: 150.0000 mg | ORAL_TABLET | ORAL | 0 refills | Status: DC

## 2023-03-27 NOTE — Progress Notes (Signed)
Tammy Terrell July 18, 1966 [id number]   History:  57 y.o. [name] presents for annual exam. Postmenopausal - on HRT for vasomotor symptoms. Cryosurgery in early 2000s. Complains of burning with urination x 2 weeks, intermittent. Denies vaginal discharge, itching or odor. Noticed a bump on labia a couple of weeks ago - -no pain, itching, drainage. Had condyloma many years ago.   Gynecologic History Patient's last menstrual period was 05/11/2019.   Contraception/Family planning: post menopausal status Sexually active: Yes  Health Maintenance Last Pap: 05/18/2020. Results were: Normal neg HPV, 5-year repeat Last mammogram (diagnostic): 12/25/2022. Results were: Stable benign left breast mass Last colonoscopy: 06/25/2018. Results were: Normal, 5-year recall Last Dexa: completed with [facility], normal per patient  Past medical history, past surgical history, family history and social history were all reviewed and documented in the EPIC chart. Married. Homemaker. 2 sons, 1 daughter.   ROS:  A ROS was performed and pertinent positives and negatives are included.  Exam:  Vitals:   03/27/23 1432  BP: 124/68  Pulse: 68  SpO2: 100%  Weight: 167 lb (75.8 kg)  Height: 5' 4.5" (1.638 m)   Body mass index is 28.22 kg/m.  General appearance:  Normal Thyroid:  Symmetrical, normal in size, without palpable masses or nodularity. Respiratory  Auscultation:  Clear without wheezing or rhonchi Cardiovascular  Auscultation:  Regular rate, without rubs, murmurs or gallops  Edema/varicosities:  Not grossly evident Abdominal  Soft,nontender, without masses, guarding or rebound.  Liver/spleen:  No organomegaly noted  Hernia:  None appreciated  Skin  Inspection:  Grossly normal Breasts: Examined lying and sitting.   Right: Without masses, retractions, nipple discharge or axillary adenopathy.   Left: Without masses, retractions, nipple discharge or axillary adenopathy. Genitourinary    Inguinal/mons:  Normal without inguinal adenopathy  External genitalia:  Normal appearing vulva with no masses, tenderness, or lesions. ~2 mm sebaceous cyst on inner right labia  BUS/Urethra/Skene's glands:  Normal  Vagina:  Discharge present, no erythema  Cervix:  Normal appearing without discharge or lesions  Uterus:  Normal in size, shape and contour.  Midline and mobile, nontender  Adnexa/parametria:     Rt: Normal in size, without masses or tenderness.   Lt: Normal in size, without masses or tenderness.  Anus and perineum: Normal  Digital rectal exam: Deferred  Patient informed chaperone available to be present for breast and pelvic exam. Patient has requested no chaperone to be present. Patient has been advised what will be completed during breast and pelvic exam.   UA negative  Wet prep + yeast (budding noted)  Assessment/Plan:  57 y.o. [id number] for annual exam.   Well female exam with routine gynecological exam - Plan: CBC with Differential/Platelet, Comprehensive metabolic panel, TSH. Education provided on SBEs, importance of preventative screenings, current guidelines, high calcium diet, regular exercise, and multivitamin daily.   Postmenopausal hormone therapy - Plan: estradiol (VIVELLE-DOT) 0.05 MG/24HR patch twice weekly, progesterone (PROMETRIUM) 100 MG capsule nightly. Doing well on this and wants to continue. Aware of risks with continued use.   Vitamin D deficiency - Plan: VITAMIN D 25 Hydroxy (Vit-D Deficiency, Fractures). Taking multivitamin.   History of gestational diabetes - Plan: Hemoglobin A1c  Burning with urination - Plan: Urinalysis,Complete w/RFL Culture, WET PREP FOR TRICH, YEAST, CLUE. Negative UA. Wet prep + yeast.   Vaginal candidiasis - Plan: fluconazole (DIFLUCAN) 150 MG tablet today and repeat in 3 days if symptoms persist.   Vulvar cyst - reassurance on benign sebaceous cyst. Will  monitor.   Screening for cervical cancer - Cryosurgery in early  2000s. Will repeat at 5-year interval per guidelines.  Screening for breast cancer - Stable benign left breast mass being followed.  Continue annual screenings.  Normal breast exam today.  Screening for colon cancer - 2019 colonoscopy. Will repeat at GI's recommended interval.   Screening for osteoporosis - managed by [facility].   Return in 1 year for annual or sooner if needed.     Tammy Mackie DNP, 3:31 PM 03/27/2023

## 2023-03-28 LAB — CBC WITH DIFFERENTIAL/PLATELET
Absolute Monocytes: 441 {cells}/uL (ref 200–950)
Basophils Absolute: 20 {cells}/uL (ref 0–200)
Basophils Relative: 0.4 %
Eosinophils Absolute: 39 {cells}/uL (ref 15–500)
Eosinophils Relative: 0.8 %
HCT: 41.5 % (ref 35.0–45.0)
Hemoglobin: 14.3 g/dL (ref 11.7–15.5)
Lymphs Abs: 1681 {cells}/uL (ref 850–3900)
MCH: 31.3 pg (ref 27.0–33.0)
MCHC: 34.5 g/dL (ref 32.0–36.0)
MCV: 90.8 fL (ref 80.0–100.0)
MPV: 11.4 fL (ref 7.5–12.5)
Monocytes Relative: 9 %
Neutro Abs: 2720 {cells}/uL (ref 1500–7800)
Neutrophils Relative %: 55.5 %
Platelets: 212 10*3/uL (ref 140–400)
RBC: 4.57 10*6/uL (ref 3.80–5.10)
RDW: 11.9 % (ref 11.0–15.0)
Total Lymphocyte: 34.3 %
WBC: 4.9 10*3/uL (ref 3.8–10.8)

## 2023-03-28 LAB — COMPREHENSIVE METABOLIC PANEL
AG Ratio: 1.7 (calc) (ref 1.0–2.5)
ALT: 12 U/L (ref 6–29)
AST: 14 U/L (ref 10–35)
Albumin: 4.8 g/dL (ref 3.6–5.1)
Alkaline phosphatase (APISO): 69 U/L (ref 37–153)
BUN: 10 mg/dL (ref 7–25)
CO2: 24 mmol/L (ref 20–32)
Calcium: 9.6 mg/dL (ref 8.6–10.4)
Chloride: 103 mmol/L (ref 98–110)
Creat: 0.59 mg/dL (ref 0.50–1.03)
Globulin: 2.9 g/dL (ref 1.9–3.7)
Glucose, Bld: 76 mg/dL (ref 65–99)
Potassium: 3.6 mmol/L (ref 3.5–5.3)
Sodium: 138 mmol/L (ref 135–146)
Total Bilirubin: 0.5 mg/dL (ref 0.2–1.2)
Total Protein: 7.7 g/dL (ref 6.1–8.1)

## 2023-03-28 LAB — VITAMIN D 25 HYDROXY (VIT D DEFICIENCY, FRACTURES): Vit D, 25-Hydroxy: 30 ng/mL (ref 30–100)

## 2023-03-28 LAB — HEMOGLOBIN A1C
Hgb A1c MFr Bld: 5.2 %{Hb} (ref <5.7)
Mean Plasma Glucose: 103 mg/dL
eAG (mmol/L): 5.7 mmol/L

## 2023-03-28 LAB — TSH: TSH: 1.22 m[IU]/L

## 2023-06-17 ENCOUNTER — Encounter: Payer: Self-pay | Admitting: Internal Medicine

## 2023-10-28 ENCOUNTER — Emergency Department (HOSPITAL_BASED_OUTPATIENT_CLINIC_OR_DEPARTMENT_OTHER)
Admission: EM | Admit: 2023-10-28 | Discharge: 2023-10-28 | Disposition: A | Discharge status: Home / Self Care | Attending: Emergency Medicine | Admitting: Emergency Medicine

## 2023-10-28 ENCOUNTER — Emergency Department (HOSPITAL_BASED_OUTPATIENT_CLINIC_OR_DEPARTMENT_OTHER): Admitting: Radiology

## 2023-10-28 ENCOUNTER — Emergency Department (HOSPITAL_BASED_OUTPATIENT_CLINIC_OR_DEPARTMENT_OTHER)

## 2023-10-28 ENCOUNTER — Other Ambulatory Visit: Payer: Self-pay | Admitting: Family Medicine

## 2023-10-28 ENCOUNTER — Other Ambulatory Visit: Payer: Self-pay

## 2023-10-28 ENCOUNTER — Encounter (HOSPITAL_BASED_OUTPATIENT_CLINIC_OR_DEPARTMENT_OTHER): Payer: Self-pay

## 2023-10-28 DIAGNOSIS — R0602 Shortness of breath: Secondary | ICD-10-CM | POA: Diagnosis present

## 2023-10-28 DIAGNOSIS — R5383 Other fatigue: Secondary | ICD-10-CM | POA: Insufficient documentation

## 2023-10-28 DIAGNOSIS — R0789 Other chest pain: Secondary | ICD-10-CM | POA: Insufficient documentation

## 2023-10-28 LAB — BASIC METABOLIC PANEL
Anion gap: 12 (ref 5–15)
BUN: 11 mg/dL (ref 6–20)
CO2: 23 mmol/L (ref 22–32)
Calcium: 10.4 mg/dL — ABNORMAL HIGH (ref 8.9–10.3)
Chloride: 103 mmol/L (ref 98–111)
Creatinine, Ser: 0.59 mg/dL (ref 0.44–1.00)
GFR, Estimated: >60 mL/min (ref >60)
Glucose, Bld: 94 mg/dL (ref 70–99)
Potassium: 4 mmol/L (ref 3.5–5.1)
Sodium: 138 mmol/L (ref 135–145)

## 2023-10-28 LAB — CBC
HCT: 43 % (ref 36.0–46.0)
Hemoglobin: 15.4 g/dL — ABNORMAL HIGH (ref 12.0–15.0)
MCH: 32 pg (ref 26.0–34.0)
MCHC: 35.8 g/dL (ref 30.0–36.0)
MCV: 89.2 fL (ref 80.0–100.0)
Platelets: 190 10*3/uL (ref 150–400)
RBC: 4.82 MIL/uL (ref 3.87–5.11)
RDW: 11.8 % (ref 11.5–15.5)
WBC: 5.5 10*3/uL (ref 4.0–10.5)
nRBC: 0 % (ref 0.0–0.2)

## 2023-10-28 LAB — TROPONIN I (HIGH SENSITIVITY)
Troponin I (High Sensitivity): <2 ng/L (ref <18)
Troponin I (High Sensitivity): <2 ng/L (ref <18)

## 2023-10-28 MED ORDER — IOHEXOL 350 MG/ML SOLN
100.0000 mL | Freq: Once | INTRAVENOUS | Status: AC | PRN
  Administered 2023-10-28: 75 mL via INTRAVENOUS

## 2023-10-28 MED ORDER — ONDANSETRON HCL 4 MG/2ML IJ SOLN
4.0000 mg | Freq: Once | INTRAMUSCULAR | Status: DC
  Filled 2023-10-28: qty 2

## 2023-10-28 NOTE — ED Triage Notes (Signed)
 In for eval of constant chest pressure onset January and has seen PCP x2. Completed steroids and Z-pack yesterday. Reports pressure worse at night. Slight nausea. Intermittently feels like she cannot catch her breath. Denies vomiting.

## 2023-10-28 NOTE — Discharge Instructions (Signed)
 As we discussed, your labs and imaging are all very reassuring and do not identify the cause of your symptoms. Please follow up with your doctor for the planned studies and any further testing felt indicated.

## 2023-10-28 NOTE — ED Notes (Signed)
 Pt discharged home and given discharge paperwork. Opportunities given for questions. Pt verbalizes understanding. PIV removed x1. Jillyn Hidden , RN

## 2023-10-28 NOTE — ED Provider Notes (Signed)
 [facility] AT [facility] Provider Note   CSN: [id number] Arrival date & time: 10/28/23  1409     History  Chief Complaint  Patient presents with   Chest Pain    Tammy Terrell is a 58 y.o. female.  Patient to ED with symptoms of chest tightness and SOB. Symptoms started about 6 weeks ago and have been persistent. No fever. She reports some mild bouts of nausea without any vomiting. No aggravating or alleviating factors, including that her symptoms are not affected by activity. Her PCP felt it was likely viral at the beginning, however, as symptoms persisted, she was prescribed a steroid and a Z-pack which she finished within the last several days. She reports no change in symptoms and no improvement over time. She is fatigued and feels generally ill.   The history is provided by the patient. No language interpreter was used.  Chest Pain      Home Medications Prior to Admission medications   Medication Sig Start Date End Date Taking? Authorizing Provider  ASPIRIN LOW DOSE 81 MG tablet Take 81 mg by mouth daily. 12/17/21   [provider]  estradiol (VIVELLE-DOT) 0.05 MG/24HR patch Place 1 patch (0.05 mg total) onto the skin 2 (two) times a week. 03/27/23   [name], NP  fexofenadine (ALLEGRA) 180 MG tablet Take 180 mg by mouth daily. 04/25/20   [provider]  finasteride (PROSCAR) 5 MG tablet Take 2.5 mg by mouth daily.    [provider]  fluconazole (DIFLUCAN) 150 MG tablet Take 1 tablet (150 mg total) by mouth every 3 (three) days. 03/27/23   Wyline Beady A, NP  ibuprofen (ADVIL,MOTRIN) 200 MG tablet Take 400 mg by mouth as needed.    [provider]  ketoconazole (NIZORAL) 2 % shampoo APP EXT 2 TO 3 TIMES A WK UTD 12/16/18   [provider]  Multiple Vitamin (MULTIVITAMIN) tablet Take 1 tablet by mouth daily.    [provider]  progesterone (PROMETRIUM) 100 MG capsule Take 1  capsule (100 mg total) by mouth daily. 05/13/19   Romualdo Bolk, MD  progesterone (PROMETRIUM) 100 MG capsule Take 1 capsule (100 mg total) by mouth daily. 03/27/23   [name], NP      Allergies    Anaspaz [hyoscyamine] and Hyoscyamine sulfate    Review of Systems   Review of Systems  Cardiovascular:  Positive for chest pain.    Physical Exam Updated Vital Signs BP (!) 151/72 (BP Location: Right Arm)   Pulse 90   Temp 98.1 F (36.7 C) (Oral)   Resp 11   Ht 5' 5.5" (1.664 m)   Wt 77.1 kg   LMP 05/11/2019 Comment: Spotting  SpO2 100%   BMI 27.86 kg/m  Physical Exam Vitals and nursing note reviewed.  Constitutional:      Appearance: She is well-developed.  HENT:     Head: Normocephalic.  Neck:     Vascular: No carotid bruit.  Cardiovascular:     Rate and Rhythm: Normal rate and regular rhythm.     Heart sounds: No murmur heard. Pulmonary:     Effort: Pulmonary effort is normal.     Breath sounds: Normal breath sounds. No wheezing, rhonchi or rales.  Abdominal:     Palpations: Abdomen is soft.     Tenderness: There is no abdominal tenderness. There is no guarding or rebound.  Musculoskeletal:        General: Normal  range of motion.     Cervical back: Normal range of motion and neck supple.     Right lower leg: No edema.     Left lower leg: No edema.  Skin:    General: Skin is warm and dry.  Neurological:     General: No focal deficit present.     Mental Status: She is alert and oriented to person, place, and time.     ED Results / Procedures / Treatments   Labs (all labs ordered are listed, but only abnormal results are displayed) Labs Reviewed  BASIC METABOLIC PANEL - Abnormal; Notable for the following components:      Result Value   Calcium 10.4 (*)    All other components within normal limits  CBC - Abnormal; Notable for the following components:   Hemoglobin 15.4 (*)    All other components within normal limits  TROPONIN I (HIGH  SENSITIVITY)  TROPONIN I (HIGH SENSITIVITY)   Results for orders placed or performed during the hospital encounter of 10/28/23  Basic metabolic panel   Collection Time: 10/28/23  2:38 PM  Result Value Ref Range   Sodium 138 135 - 145 mmol/L   Potassium 4.0 3.5 - 5.1 mmol/L   Chloride 103 98 - 111 mmol/L   CO2 23 22 - 32 mmol/L   Glucose, Bld 94 70 - 99 mg/dL   BUN 11 6 - 20 mg/dL   Creatinine, Ser 4.16 0.44 - 1.00 mg/dL   Calcium 60.6 (H) 8.9 - 10.3 mg/dL   GFR, Estimated >30 >16 mL/min   Anion gap 12 5 - 15  CBC   Collection Time: 10/28/23  2:38 PM  Result Value Ref Range   WBC 5.5 4.0 - 10.5 K/uL   RBC 4.82 3.87 - 5.11 MIL/uL   Hemoglobin 15.4 (H) 12.0 - 15.0 g/dL   HCT 01.0 93.2 - 35.5 %   MCV 89.2 80.0 - 100.0 fL   MCH 32.0 26.0 - 34.0 pg   MCHC 35.8 30.0 - 36.0 g/dL   RDW 73.2 20.2 - 54.2 %   Platelets 190 150 - 400 K/uL   nRBC 0.0 0.0 - 0.2 %  Troponin I (High Sensitivity)   Collection Time: 10/28/23  2:38 PM  Result Value Ref Range   Troponin I (High Sensitivity) <2 <18 ng/L  Troponin I (High Sensitivity)   Collection Time: 10/28/23  4:36 PM  Result Value Ref Range   Troponin I (High Sensitivity) <2 <18 ng/L    EKG EKG Interpretation Date/Time:  Tuesday October 28 2023 14:24:10 EDT Ventricular Rate:  95 PR Interval:  114 QRS Duration:  72 QT Interval:  360 QTC Calculation: 452 R Axis:   71  Text Interpretation: Normal sinus rhythm Right atrial enlargement Nonspecific ST abnormality Abnormal ECG No old tracing to compare Confirmed by Melene Plan [id number]) on 10/28/2023 3:13:11 PM  Radiology CT Angio Chest PE W and/or Wo Contrast Result Date: 10/28/2023 CLINICAL DATA:  Chest pressure. EXAM: CT ANGIOGRAPHY CHEST WITH CONTRAST TECHNIQUE: Multidetector CT imaging of the chest was performed using the standard protocol during bolus administration of intravenous contrast. Multiplanar CT image reconstructions and MIPs were obtained to evaluate the vascular anatomy.  RADIATION DOSE REDUCTION: This exam was performed according to the departmental dose-optimization program which includes automated exposure control, adjustment of the mA and/or kV according to patient size and/or use of iterative reconstruction technique. CONTRAST:  75mL OMNIPAQUE IOHEXOL 350 MG/ML SOLN COMPARISON:  None Available. FINDINGS: Cardiovascular: Satisfactory opacification  of the pulmonary arteries to the segmental level. No evidence of pulmonary embolism. Normal heart size. No pericardial effusion. Mediastinum/Nodes: No enlarged mediastinal, hilar, or axillary lymph nodes. Thyroid gland, trachea, and esophagus demonstrate no significant findings. Lungs/Pleura: Lungs are clear. No pleural effusion or pneumothorax. Upper Abdomen: No acute abnormality. Musculoskeletal: No chest wall abnormality. No acute or significant osseous findings. Review of the MIP images confirms the above findings. IMPRESSION: No definite evidence of pulmonary embolus.1 Electronically Signed   By: Lupita Raider M.D.   On: 10/28/2023 17:37   DG Chest 2 View Result Date: 10/28/2023 CLINICAL DATA:  Chest pain. EXAM: CHEST - 2 VIEW COMPARISON:  None Available. FINDINGS: Normal cardiac and mediastinal contours. Nodular consolidative opacity right lower lung. No pleural effusion or pneumothorax. Osseous structures unremarkable. IMPRESSION: Nodular consolidative opacity right lower lung. Findings are nonspecific and may represent atelectasis or infection. Recommend short-term follow-up chest radiograph in 2-3 weeks to ensure resolution. Electronically Signed   By: Annia Belt M.D.   On: 10/28/2023 14:59    Procedures Procedures    Medications Ordered in ED Medications  ondansetron (ZOFRAN) injection 4 mg (4 mg Intravenous Not Given 10/28/23 1720)  iohexol (OMNIPAQUE) 350 MG/ML injection 100 mL (75 mLs Intravenous Contrast Given 10/28/23 1643)    ED Course/ Medical Decision Making/ A&P                                  Medical Decision Making This patient presents to the ED for concern of sob, this involves an extensive number of treatment options, and is a complaint that carries with it a high risk of complications and morbidity.  The differential diagnosis includes ACS, PE, PNA, CHF   Co morbidities that complicate the patient evaluation  On HRT   Additional history obtained:  Additional history and/or information obtained from chart review, notable for n/a   Lab Tests:  I Ordered, and personally interpreted labs.  The pertinent results include:  No leukocytosis, normal hgb, normal electrolytes, normal renal function, troponin <2.    Imaging Studies ordered:  I ordered imaging studies including CXR I independently visualized and interpreted imaging which showed Nodular opacity in RLL, atx vs pna. I agree with the radiologist interpretation   Cardiac Monitoring:  The patient was maintained on a cardiac monitor.  I personally viewed and interpreted the cardiac monitored which showed an underlying rhythm of: NSR, nonspecific ST changes    Test Considered:  CTA considered as cause of symptoms not identified, felt at risk for PE (on estrogen, initially tachycardic) and this was discussed with the patient is shared decision making. She would like for the study to be performed.    Critical Interventions:  N/a   Consultations Obtained:  I requested consultation with the n/a,  and discussed lab and imaging findings as well as pertinent plan - they recommend: n/a   Problem List / ED Course:  Prolonged symptoms of SOB, chest tightness, fatigue Labs reassuring, CXR ?atx vs PNA As above, discussed CT scan with the patient - study is negative She can be discharged home to continue follow up with PCP   Reevaluation:  After the interventions noted above, I reevaluated the patient and found that they have :stayed the same   Social Determinants of Health:  Nonsmoker, lives with  husband   Disposition:  After consideration of the diagnostic results and the patients response to treatment, I feel  that the patient would benefit from discharge home..   Amount and/or Complexity of Data Reviewed Labs: ordered. Radiology: ordered.  Risk Prescription drug management.           Final Clinical Impression(s) / ED Diagnoses Final diagnoses:  Shortness of breath  Other fatigue    Rx / DC Orders ED Discharge Orders     None         Elpidio Anis, PA-C 10/28/23 1834    Melene Plan, DO 10/28/23 1903

## 2023-10-29 ENCOUNTER — Ambulatory Visit (INDEPENDENT_AMBULATORY_CARE_PROVIDER_SITE_OTHER): Payer: Self-pay

## 2023-10-29 DIAGNOSIS — R0789 Other chest pain: Secondary | ICD-10-CM

## 2024-01-14 ENCOUNTER — Telehealth: Payer: Self-pay | Admitting: *Deleted

## 2024-01-14 NOTE — Telephone Encounter (Signed)
 Call transferred from front office. Patient requesting OV for possible yeast. Reports she returned from vacation, reports external burning sensation and itching for the past 2 days. Has not tried anything OTC, would prefer OV. Denies vaginal d/c, odor, bleeding or urinary symptoms.  Patient aware Jyl Or is out of the office, agreeable to schedule with another provider.   OV scheduled for 6/12 at 1000 with Jami, arrive at 0945 for check in.   Routing to provider for final review. Patient is agreeable to disposition. Will close encounter.

## 2024-01-15 ENCOUNTER — Encounter: Payer: Self-pay | Admitting: Radiology

## 2024-01-15 ENCOUNTER — Ambulatory Visit: Admitting: Radiology

## 2024-01-15 VITALS — BP 116/80 | HR 81 | Wt 174.4 lb

## 2024-01-15 DIAGNOSIS — N949 Unspecified condition associated with female genital organs and menstrual cycle: Secondary | ICD-10-CM | POA: Diagnosis not present

## 2024-01-15 DIAGNOSIS — B3731 Acute candidiasis of vulva and vagina: Secondary | ICD-10-CM | POA: Diagnosis not present

## 2024-01-15 LAB — WET PREP FOR TRICH, YEAST, CLUE

## 2024-01-15 MED ORDER — FLUCONAZOLE 150 MG PO TABS: 150.0000 mg | ORAL_TABLET | ORAL | 0 refills | Status: DC

## 2024-01-15 NOTE — Progress Notes (Signed)
      Subjective: Tammy Terrell is a 58 y.o. female who complains of vaginal burning, itching, dampness--no increased discharge, denies urinary symptoms. Symptoms began 2-3 days ago.     Review of Systems  All other systems reviewed and are negative.   Past Medical History:  Diagnosis Date   History of gestational diabetes    Hx of adenomatous colonic polyps 07/05/2018   Migraine without aura       Objective:  Today's Vitals   01/15/24 0959  BP: 116/80  Pulse: 81  SpO2: 96%  Weight: 174 lb 6.4 oz (79.1 kg)   Body mass index is 28.58 kg/m.   Physical Exam Vitals and nursing note reviewed. Exam conducted with a chaperone present.  Constitutional:      Appearance: Normal appearance. She is well-developed.  Pulmonary:     Effort: Pulmonary effort is normal.  Abdominal:     General: Abdomen is flat.     Palpations: Abdomen is soft.  Genitourinary:    General: Normal vulva.     Vagina: Vaginal discharge present. No erythema, bleeding or lesions.     Cervix: Normal. No discharge, friability, lesion or erythema.     Uterus: Normal.      Adnexa: Right adnexa normal and left adnexa normal.   Neurological:     Mental Status: She is alert.   Psychiatric:        Mood and Affect: Mood normal.        Thought Content: Thought content normal.        Judgment: Judgment normal.    Microscopic wet-mount exam shows hyphae.   Ellis Guys, CMA present for exam  Assessment:/Plan:  1. Vaginal discomfort (Primary) - WET PREP FOR TRICH, YEAST, CLUE  2. Vaginal candidiasis - fluconazole  (DIFLUCAN ) 150 MG tablet; Take 1 tablet (150 mg total) by mouth every 3 (three) days.  Dispense: 3 tablet; Refill: 0     Kabella Cassidy B, NP 10:15 AM

## 2024-03-24 ENCOUNTER — Other Ambulatory Visit: Payer: Self-pay | Admitting: Family Medicine

## 2024-03-24 DIAGNOSIS — N6320 Unspecified lump in the left breast, unspecified quadrant: Secondary | ICD-10-CM

## 2024-03-25 ENCOUNTER — Inpatient Hospital Stay
Admission: RE | Admit: 2024-03-25 | Discharge: 2024-03-25 | Discharge status: Home / Self Care | Source: Ambulatory Visit | Attending: Family Medicine | Admitting: Family Medicine

## 2024-03-25 ENCOUNTER — Ambulatory Visit
Admission: RE | Admit: 2024-03-25 | Discharge: 2024-03-25 | Disposition: A | Discharge status: Home / Self Care | Source: Ambulatory Visit | Attending: Family Medicine | Admitting: Family Medicine

## 2024-03-25 DIAGNOSIS — N6320 Unspecified lump in the left breast, unspecified quadrant: Secondary | ICD-10-CM

## 2024-04-08 NOTE — Progress Notes (Unsigned)
 Tammy Terrell Jul 29, 1966 [id number]   History:  58 y.o. [id number] presents for annual exam. Postmenopausal - on HRT for vasomotor symptoms. Good management. Cryosurgery in early 2000s. Treated for yeast in June and wants to recheck today.   Gynecologic History Patient's last menstrual period was 05/11/2019.   Contraception/Family planning: post menopausal status Sexually active: Yes  Health Maintenance Last Pap: 05/18/2020. Results were: Normal neg HPV Last mammogram (diagnostic): 03/25/2024. Results were: Stable benign left breast mass, likely fibroadenoma Last colonoscopy: 06/25/2018. Results were: Normal, 10-year recall Last Dexa: completed with [facility], normal per patient     04/09/2024   12:11 PM  Depression screen PHQ 2/9  Decreased Interest 0  Down, Depressed, Hopeless 0  PHQ - 2 Score 0     Past medical history, past surgical history, family history and social history were all reviewed and documented in the EPIC chart. Married. Homemaker. Daughter is HS Editor, commissioning, 1 son teaches history and logic, and one son is doing residency in [place] for [employer]   ROS:  A ROS was performed and pertinent positives and negatives are included.  Exam:  Vitals:   04/09/24 1204  BP: 116/78  Pulse: 90  SpO2: 98%  Weight: 168 lb (76.2 kg)  Height: 5' 5 (1.651 m)    Body mass index is 27.96 kg/m.  General appearance:  Normal Thyroid:  Symmetrical, normal in size, without palpable masses or nodularity. Respiratory  Auscultation:  Clear without wheezing or rhonchi Cardiovascular  Auscultation:  Regular rate, without rubs, murmurs or gallops  Edema/varicosities:  Not grossly evident Abdominal  Soft,nontender, without masses, guarding or rebound.  Liver/spleen:  No organomegaly noted  Hernia:  None appreciated  Skin  Inspection:  Grossly normal Breasts: Examined lying and sitting.   Right: Without masses, retractions, nipple discharge or axillary  adenopathy.   Left: Without masses, retractions, nipple discharge or axillary adenopathy. Pelvic: External genitalia:  Normal appearing vulva with no masses, tenderness, or lesions. ~2 mm sebaceous cyst on inner right labia              Urethra:  normal appearing urethra with no masses, tenderness or lesions              Bartholins and Skenes: normal                 Vagina: normal appearing vagina with normal color and discharge, no lesions              Cervix: no lesions Bimanual Exam:  Uterus:  no masses or tenderness              Adnexa: no mass, fullness, tenderness              Rectovaginal: Deferred              Anus:  normal, no lesions  Dereck Terrell, CMA present as chaperone.   Wet prep + yeast (budding noted)  Assessment/Plan:  58 y.o. [id number] for annual exam.   Well female exam with routine gynecological exam - Plan: CBC with Differential/Platelet, Comprehensive metabolic panel. Education provided on SBEs, importance of preventative screenings, current guidelines, high calcium diet, regular exercise, and multivitamin daily.   Postmenopausal hormone therapy - Plan: estradiol  (VIVELLE -DOT) 0.05 MG/24HR patch twice weekly, progesterone  (PROMETRIUM ) 100 MG capsule nightly. Doing well on this and wants to continue.   Vitamin D  deficiency - Plan: VITAMIN D  25 Hydroxy (Vit-D Deficiency, Fractures). Taking multivitamin.   History of gestational  diabetes - Plan: Hemoglobin A1c  Vaginal itching - Plan: WET PREP FOR TRICH, YEAST, CLUE  Vaginal candidiasis - Plan: terconazole  (TERAZOL 7 ) 0.4 % vaginal cream x 7 nights.   Screening for cervical cancer - Cryosurgery in early 2000s. Will repeat at 5-year interval per guidelines.  Screening for breast cancer - Stable benign left breast mass being followed.  Continue annual screenings.  Normal breast exam today.  Screening for colon cancer - 2019 colonoscopy. Will repeat at GI's recommended interval.   Screening for osteoporosis -  managed by [facility].   Return in about 1 year (around 04/09/2025) for Annual.     Tammy DELENA Shutter DNP, 12:22 PM 04/09/2024

## 2024-04-09 ENCOUNTER — Encounter: Payer: Self-pay | Admitting: Nurse Practitioner

## 2024-04-09 ENCOUNTER — Ambulatory Visit: Admitting: Nurse Practitioner

## 2024-04-09 VITALS — BP 116/78 | HR 90 | Ht 65.0 in | Wt 168.0 lb

## 2024-04-09 DIAGNOSIS — Z79890 Hormone replacement therapy (postmenopausal): Secondary | ICD-10-CM

## 2024-04-09 DIAGNOSIS — E559 Vitamin D deficiency, unspecified: Secondary | ICD-10-CM | POA: Diagnosis not present

## 2024-04-09 DIAGNOSIS — B3731 Acute candidiasis of vulva and vagina: Secondary | ICD-10-CM

## 2024-04-09 DIAGNOSIS — Z8632 Personal history of gestational diabetes: Secondary | ICD-10-CM

## 2024-04-09 DIAGNOSIS — Z1331 Encounter for screening for depression: Secondary | ICD-10-CM | POA: Diagnosis not present

## 2024-04-09 DIAGNOSIS — E7800 Pure hypercholesterolemia, unspecified: Secondary | ICD-10-CM

## 2024-04-09 DIAGNOSIS — Z01419 Encounter for gynecological examination (general) (routine) without abnormal findings: Secondary | ICD-10-CM

## 2024-04-09 DIAGNOSIS — Z780 Asymptomatic menopausal state: Secondary | ICD-10-CM

## 2024-04-09 DIAGNOSIS — N898 Other specified noninflammatory disorders of vagina: Secondary | ICD-10-CM

## 2024-04-09 LAB — WET PREP FOR TRICH, YEAST, CLUE

## 2024-04-09 MED ORDER — TERCONAZOLE 0.4 % VA CREA: 1.0000 | TOPICAL_CREAM | Freq: Every day | VAGINAL | 0 refills | Status: AC

## 2024-04-09 MED ORDER — PROGESTERONE MICRONIZED 100 MG PO CAPS: 100.0000 mg | ORAL_CAPSULE | Freq: Every evening | ORAL | 3 refills | Status: AC

## 2024-04-09 MED ORDER — ESTRADIOL 0.05 MG/24HR TD PTTW: 1.0000 | MEDICATED_PATCH | TRANSDERMAL | 3 refills | Status: AC

## 2024-04-10 LAB — COMPREHENSIVE METABOLIC PANEL WITH GFR
AG Ratio: 2 (calc) (ref 1.0–2.5)
ALT: 12 U/L (ref 6–29)
AST: 16 U/L (ref 10–35)
Albumin: 4.9 g/dL (ref 3.6–5.1)
Alkaline phosphatase (APISO): 61 U/L (ref 37–153)
BUN: 11 mg/dL (ref 7–25)
CO2: 26 mmol/L (ref 20–32)
Calcium: 9.3 mg/dL (ref 8.6–10.4)
Chloride: 105 mmol/L (ref 98–110)
Creat: 0.59 mg/dL (ref 0.50–1.03)
Globulin: 2.5 g/dL (ref 1.9–3.7)
Glucose, Bld: 99 mg/dL (ref 65–99)
Potassium: 4.1 mmol/L (ref 3.5–5.3)
Sodium: 140 mmol/L (ref 135–146)
Total Bilirubin: 0.5 mg/dL (ref 0.2–1.2)
Total Protein: 7.4 g/dL (ref 6.1–8.1)
eGFR: 106 mL/min/1.73m2 (ref >=60)

## 2024-04-10 LAB — CBC WITH DIFFERENTIAL/PLATELET
Absolute Lymphocytes: 1065 {cells}/uL (ref 850–3900)
Absolute Monocytes: 320 {cells}/uL (ref 200–950)
Basophils Absolute: 12 {cells}/uL (ref 0–200)
Basophils Relative: 0.3 %
Eosinophils Absolute: 31 {cells}/uL (ref 15–500)
Eosinophils Relative: 0.8 %
HCT: 41.2 % (ref 35.0–45.0)
Hemoglobin: 14 g/dL (ref 11.7–15.5)
MCH: 32 pg (ref 27.0–33.0)
MCHC: 34 g/dL (ref 32.0–36.0)
MCV: 94.1 fL (ref 80.0–100.0)
MPV: 11.7 fL (ref 7.5–12.5)
Monocytes Relative: 8.2 %
Neutro Abs: 2473 {cells}/uL (ref 1500–7800)
Neutrophils Relative %: 63.4 %
Platelets: 183 Thousand/uL (ref 140–400)
RBC: 4.38 Million/uL (ref 3.80–5.10)
RDW: 11.9 % (ref 11.0–15.0)
Total Lymphocyte: 27.3 %
WBC: 3.9 Thousand/uL (ref 3.8–10.8)

## 2024-04-10 LAB — HEMOGLOBIN A1C
Hgb A1c MFr Bld: 5.1 % (ref <5.7)
Mean Plasma Glucose: 100 mg/dL
eAG (mmol/L): 5.5 mmol/L

## 2024-04-10 LAB — VITAMIN D 25 HYDROXY (VIT D DEFICIENCY, FRACTURES): Vit D, 25-Hydroxy: 29 ng/mL — ABNORMAL LOW (ref 30–100)

## 2024-04-12 ENCOUNTER — Ambulatory Visit: Payer: Self-pay | Admitting: Nurse Practitioner
